# Patient Record
Sex: Male | Born: 1965 | Race: White | Hispanic: No | Marital: Single | Smoking: Current some day smoker
Health system: Southern US, Community
[De-identification: ages and names within clinical notes are randomized; demographics above are authoritative.]

## PROBLEM LIST (undated history)

## (undated) DIAGNOSIS — H579 Unspecified disorder of eye and adnexa: Secondary | ICD-10-CM

## (undated) DIAGNOSIS — M542 Cervicalgia: Secondary | ICD-10-CM

## (undated) DIAGNOSIS — G8929 Other chronic pain: Secondary | ICD-10-CM

## (undated) DIAGNOSIS — G4733 Obstructive sleep apnea (adult) (pediatric): Secondary | ICD-10-CM

## (undated) DIAGNOSIS — F419 Anxiety disorder, unspecified: Secondary | ICD-10-CM

## (undated) DIAGNOSIS — M549 Dorsalgia, unspecified: Secondary | ICD-10-CM

## (undated) HISTORY — DX: Other chronic pain: G89.29

## (undated) HISTORY — PX: EYE SURGERY: SHX253

## (undated) HISTORY — DX: Dorsalgia, unspecified: M54.9

## (undated) HISTORY — DX: Unspecified disorder of eye and adnexa: H57.9

## (undated) HISTORY — DX: Anxiety disorder, unspecified: F41.9

## (undated) HISTORY — DX: Obstructive sleep apnea (adult) (pediatric): G47.33

## (undated) HISTORY — DX: Cervicalgia: M54.2

---

## 2012-11-03 ENCOUNTER — Encounter: Payer: Self-pay | Admitting: Physician Assistant

## 2012-11-03 ENCOUNTER — Ambulatory Visit (INDEPENDENT_AMBULATORY_CARE_PROVIDER_SITE_OTHER): Payer: Medicaid Other | Admitting: Physician Assistant

## 2012-11-03 VITALS — BP 105/67 | HR 83 | Temp 97.0°F | Ht 68.0 in | Wt 223.4 lb

## 2012-11-03 DIAGNOSIS — Z0000 Encounter for general adult medical examination without abnormal findings: Secondary | ICD-10-CM

## 2012-11-03 DIAGNOSIS — M545 Low back pain: Secondary | ICD-10-CM

## 2012-11-03 DIAGNOSIS — H543 Unqualified visual loss, both eyes: Secondary | ICD-10-CM

## 2012-11-03 DIAGNOSIS — F17200 Nicotine dependence, unspecified, uncomplicated: Secondary | ICD-10-CM | POA: Insufficient documentation

## 2012-11-03 LAB — LIPID PANEL
HDL: 32 mg/dL — ABNORMAL LOW (ref >39)
LDL Cholesterol: 90 mg/dL (ref 0–99)
Total CHOL/HDL Ratio: 5.2 Ratio
VLDL: 43 mg/dL — ABNORMAL HIGH (ref 0–40)

## 2012-11-03 LAB — HEPATIC FUNCTION PANEL
ALT: 30 U/L (ref 0–53)
Bilirubin, Direct: 0.2 mg/dL (ref 0.0–0.3)
Total Bilirubin: 0.9 mg/dL (ref 0.3–1.2)

## 2012-11-03 LAB — BASIC METABOLIC PANEL WITH GFR
Calcium: 9.3 mg/dL (ref 8.4–10.5)
GFR, Est African American: >89 mL/min
GFR, Est Non African American: >89 mL/min
Glucose, Bld: 82 mg/dL (ref 70–99)
Potassium: 4.1 mEq/L (ref 3.5–5.3)
Sodium: 139 mEq/L (ref 135–145)

## 2012-11-03 MED ORDER — BUPROPION HCL ER (SMOKING DET) 150 MG PO TB12: 150.0000 mg | ORAL_TABLET | Freq: Two times a day (BID) | ORAL | Status: DC

## 2012-11-03 NOTE — Patient Instructions (Signed)
Smoking Cessation Quitting smoking is important to your health and has many advantages. However, it is not always easy to quit since nicotine is a very addictive drug. Often times, people try 3 times or more before being able to quit. This document explains the best ways for you to prepare to quit smoking. Quitting takes hard work and a lot of effort, but you can do it. ADVANTAGES OF QUITTING SMOKING  You will live longer, feel better, and live better.  Your body will feel the impact of quitting smoking almost immediately.  Within 20 minutes, blood pressure decreases. Your pulse returns to its normal level.  After 8 hours, carbon monoxide levels in the blood return to normal. Your oxygen level increases.  After 24 hours, the chance of having a heart attack starts to decrease. Your breath, hair, and body stop smelling like smoke.  After 48 hours, damaged nerve endings begin to recover. Your sense of taste and smell improve.  After 72 hours, the body is virtually free of nicotine. Your bronchial tubes relax and breathing becomes easier.  After 2 to 12 weeks, lungs can hold more air. Exercise becomes easier and circulation improves.  The risk of having a heart attack, stroke, cancer, or lung disease is greatly reduced.  After 1 year, the risk of coronary heart disease is cut in half.  After 5 years, the risk of stroke falls to the same as a nonsmoker.  After 10 years, the risk of lung cancer is cut in half and the risk of other cancers decreases significantly.  After 15 years, the risk of coronary heart disease drops, usually to the level of a nonsmoker.  If you are pregnant, quitting smoking will improve your chances of having a healthy baby.  The people you live with, especially any children, will be healthier.  You will have extra money to spend on things other than cigarettes. QUESTIONS TO THINK ABOUT BEFORE ATTEMPTING TO QUIT You may want to talk about your answers with your  caregiver.  Why do you want to quit?  If you tried to quit in the past, what helped and what did not?  What will be the most difficult situations for you after you quit? How will you plan to handle them?  Who can help you through the tough times? Your family? Friends? A caregiver?  What pleasures do you get from smoking? What ways can you still get pleasure if you quit? Here are some questions to ask your caregiver:  How can you help me to be successful at quitting?  What medicine do you think would be best for me and how should I take it?  What should I do if I need more help?  What is smoking withdrawal like? How can I get information on withdrawal? GET READY  Set a quit date.  Change your environment by getting rid of all cigarettes, ashtrays, matches, and lighters in your home, car, or work. Do not let people smoke in your home.  Review your past attempts to quit. Think about what worked and what did not. GET SUPPORT AND ENCOURAGEMENT You have a better chance of being successful if you have help. You can get support in many ways.  Tell your family, friends, and co-workers that you are going to quit and need their support. Ask them not to smoke around you.  Get individual, group, or telephone counseling and support. Programs are available at local hospitals and health centers. Call your local health department for   information about programs in your area.  Spiritual beliefs and practices may help some smokers quit.  Download a "quit meter" on your computer to keep track of quit statistics, such as how long you have gone without smoking, cigarettes not smoked, and money saved.  Get a self-help book about quitting smoking and staying off of tobacco. LEARN NEW SKILLS AND BEHAVIORS  Distract yourself from urges to smoke. Talk to someone, go for a walk, or occupy your time with a task.  Change your normal routine. Take a different route to work. Drink tea instead of coffee.  Eat breakfast in a different place.  Reduce your stress. Take a hot bath, exercise, or read a book.  Plan something enjoyable to do every day. Reward yourself for not smoking.  Explore interactive web-based programs that specialize in helping you quit. GET MEDICINE AND USE IT CORRECTLY Medicines can help you stop smoking and decrease the urge to smoke. Combining medicine with the above behavioral methods and support can greatly increase your chances of successfully quitting smoking.  Nicotine replacement therapy helps deliver nicotine to your body without the negative effects and risks of smoking. Nicotine replacement therapy includes nicotine gum, lozenges, inhalers, nasal sprays, and skin patches. Some may be available over-the-counter and others require a prescription.  Antidepressant medicine helps people abstain from smoking, but how this works is unknown. This medicine is available by prescription.  Nicotinic receptor partial agonist medicine simulates the effect of nicotine in your brain. This medicine is available by prescription. Ask your caregiver for advice about which medicines to use and how to use them based on your health history. Your caregiver will tell you what side effects to look out for if you choose to be on a medicine or therapy. Carefully read the information on the package. Do not use any other product containing nicotine while using a nicotine replacement product.  RELAPSE OR DIFFICULT SITUATIONS Most relapses occur within the first 3 months after quitting. Do not be discouraged if you start smoking again. Remember, most people try several times before finally quitting. You may have symptoms of withdrawal because your body is used to nicotine. You may crave cigarettes, be irritable, feel very hungry, cough often, get headaches, or have difficulty concentrating. The withdrawal symptoms are only temporary. They are strongest when you first quit, but they will go away within  10 14 days. To reduce the chances of relapse, try to:  Avoid drinking alcohol. Drinking lowers your chances of successfully quitting.  Reduce the amount of caffeine you consume. Once you quit smoking, the amount of caffeine in your body increases and can give you symptoms, such as a rapid heartbeat, sweating, and anxiety.  Avoid smokers because they can make you want to smoke.  Do not let weight gain distract you. Many smokers will gain weight when they quit, usually less than 10 pounds. Eat a healthy diet and stay active. You can always lose the weight gained after you quit.  Find ways to improve your mood other than smoking. FOR MORE INFORMATION  www.smokefree.gov  Document Released: 05/29/2001 Document Revised: 12/04/2011 Document Reviewed: 09/13/2011 Exit[employer] Patient Information 2013 [employer].  

## 2012-11-03 NOTE — Progress Notes (Signed)
Subjective:     Patient ID: Roberto Hanson, male   DOB: [birth year], 47 y.o.   MRN: [id number]  HPI Comments: Pt here for PE Extended amt of time since last PE Pt long term smker and would like Zyban rx He has a long hx of vision def with eye surg He would like referral    Review of Systems  Constitutional: Negative.   HENT: Negative.   Eyes:       Pt with sig hx of vision def  Respiratory: Negative.   Cardiovascular: Negative.   Gastrointestinal: Negative.   Endocrine: Negative.   Genitourinary: Negative.   Musculoskeletal: Positive for back pain.  Allergic/Immunologic: Negative.   Neurological: Negative.   Hematological: Negative.   Psychiatric/Behavioral: Negative.        Objective:   Physical Exam  Nursing note and vitals reviewed. Constitutional: He is oriented to person, place, and time. He appears well-developed and well-nourished.  HENT:  Head: Normocephalic and atraumatic.  Right Ear: External ear normal.  Left Ear: External ear normal.  Nose: Nose normal.  Mouth/Throat: Oropharynx is clear and moist.  Eyes: Conjunctivae are normal. Pupils are equal, round, and reactive to light.  Neck: Normal range of motion. Neck supple. No JVD present. No tracheal deviation present. No thyromegaly present.  Cardiovascular: Normal rate, regular rhythm and normal heart sounds.   Pulmonary/Chest: Effort normal and breath sounds normal.  Abdominal: Soft. Bowel sounds are normal. He exhibits no distension and no mass. There is no tenderness. There is no rebound and no guarding.  Genitourinary: Penis normal. No penile tenderness.  No hernia or ing nodes  Musculoskeletal: Normal range of motion.  Lymphadenopathy:    He has no cervical adenopathy.  Neurological: He is alert and oriented to person, place, and time. He has normal reflexes. No cranial nerve deficit.  Skin: Skin is warm and dry.  Psychiatric: He has a normal mood and affect.       Assessment:    Physical  exam 1. Smoking addiction   2. Decreased vision in both eyes   3. Lumbago   4. Routine general medical examination at a health care facility        Plan:     Zyban rx called in- informed ins may not cover Referral to [facility] Weight loss discussed F/U pending labs

## 2012-11-04 LAB — PSA: PSA: 0.97 ng/mL (ref <=4.00)

## 2012-12-01 ENCOUNTER — Telehealth: Payer: Self-pay | Admitting: Physician Assistant

## 2012-12-02 NOTE — Telephone Encounter (Signed)
Waiting on Bill W to sign

## 2012-12-05 NOTE — Telephone Encounter (Signed)
Form faxed, pt aware. 

## 2013-01-27 ENCOUNTER — Other Ambulatory Visit: Payer: Self-pay

## 2013-01-27 MED ORDER — BUPROPION HCL ER (SMOKING DET) 150 MG PO TB12: 150.0000 mg | ORAL_TABLET | Freq: Two times a day (BID) | ORAL | Status: DC

## 2013-01-27 NOTE — Telephone Encounter (Signed)
Last seen 11/03/12  [facility]   

## 2013-02-02 DIAGNOSIS — F411 Generalized anxiety disorder: Secondary | ICD-10-CM | POA: Diagnosis not present

## 2013-02-09 ENCOUNTER — Ambulatory Visit (INDEPENDENT_AMBULATORY_CARE_PROVIDER_SITE_OTHER): Payer: Medicare Other | Admitting: Family Medicine

## 2013-02-09 VITALS — BP 120/77 | HR 98 | Temp 97.7°F | Ht 68.0 in | Wt 209.0 lb

## 2013-02-09 DIAGNOSIS — F411 Generalized anxiety disorder: Secondary | ICD-10-CM

## 2013-02-09 MED ORDER — ALPRAZOLAM 0.5 MG PO TABS: 0.5000 mg | ORAL_TABLET | Freq: Every evening | ORAL | Status: DC | PRN

## 2013-02-09 NOTE — Patient Instructions (Signed)

## 2013-02-09 NOTE — Progress Notes (Signed)
  Subjective:    Patient ID: Roberto Hanson, male    DOB: 1966-02-15, 47 y.o.   MRN: [id number]  HPI This 47 y.o. male presents for evaluation of osas.  He is using cpap for osas And he is sleeping well and he states he has been doing very well.  He has Been having a lot of anxiety and he has a lot of social anxiety.  He wants some Medicine for anxiety.  He has noticed his stress level is high and he would like something For stress.  He states he gets nervous 3x week lately.  He needs an eval for his sleep apnea In order to get a new cpap machine.   Review of Systems  C/o anxiety.  No chest pain, SOB, HA, dizziness, vision change, N/V, diarrhea, constipation, dysuria, urinary urgency or frequency, myalgias, arthralgias or rash.     Objective:   Physical Exam Vital signs noted  Well developed well nourished male.  HEENT - Head atraumatic Normocephalic                Eyes - PERRLA, Conjuctiva - clear Sclera- Clear EOMI                Ears - EAC's Wnl TM's Wnl Gross Hearing WNL                Nose - Nares patent                 Throat - oropharanx wnl Respiratory - Lungs CTA bilateral Cardiac - RRR S1 and S2 without murmur GI - Abdomen soft Nontender and bowel sounds active x 4 Extremities - No edema. Neuro - Grossly intact.       Assessment & Plan:  Anxiety state, unspecified - Plan: ALPRAZolam (XANAX) 0.5 MG tablet Continue buproprion 150mg  po bid. Follow up for refills.  OSAS - Continue current CPAP

## 2013-03-13 ENCOUNTER — Ambulatory Visit: Payer: Medicare Other | Admitting: Family Medicine

## 2013-03-16 ENCOUNTER — Telehealth: Payer: Self-pay | Admitting: Family Medicine

## 2013-03-16 ENCOUNTER — Ambulatory Visit (INDEPENDENT_AMBULATORY_CARE_PROVIDER_SITE_OTHER): Payer: Medicare Other | Admitting: Family Medicine

## 2013-03-16 ENCOUNTER — Encounter: Payer: Self-pay | Admitting: Family Medicine

## 2013-03-16 VITALS — BP 122/81 | HR 81 | Temp 98.7°F | Ht 68.0 in | Wt 217.0 lb

## 2013-03-16 DIAGNOSIS — F411 Generalized anxiety disorder: Secondary | ICD-10-CM

## 2013-03-16 DIAGNOSIS — M549 Dorsalgia, unspecified: Secondary | ICD-10-CM

## 2013-03-16 DIAGNOSIS — G4733 Obstructive sleep apnea (adult) (pediatric): Secondary | ICD-10-CM

## 2013-03-16 DIAGNOSIS — F524 Premature ejaculation: Secondary | ICD-10-CM

## 2013-03-16 MED ORDER — SERTRALINE HCL 50 MG PO TABS: 50.0000 mg | ORAL_TABLET | Freq: Every day | ORAL | Status: DC

## 2013-03-16 MED ORDER — NAPROXEN 500 MG PO TABS
500.0000 mg | ORAL_TABLET | Freq: Two times a day (BID) | ORAL | Status: DC
Start: 2013-03-16 — End: 2013-03-29

## 2013-03-16 MED ORDER — ALPRAZOLAM 0.5 MG PO TABS: 0.5000 mg | ORAL_TABLET | Freq: Every evening | ORAL | Status: DC | PRN

## 2013-03-16 NOTE — Telephone Encounter (Signed)
Taken care of already

## 2013-03-16 NOTE — Patient Instructions (Signed)
Back Pain, Adult  Low back pain is very common. About 1 in 5 people have back pain. The cause of low back pain is rarely dangerous. The pain often gets better over time. About half of people with a sudden onset of back pain feel better in just 2 weeks. About 8 in 10 people feel better by 6 weeks.   CAUSES  Some common causes of back pain include:  · Strain of the muscles or ligaments supporting the spine.  · Wear and tear (degeneration) of the spinal discs.  · Arthritis.  · Direct injury to the back.  DIAGNOSIS  Most of the time, the direct cause of low back pain is not known. However, back pain can be treated effectively even when the exact cause of the pain is unknown. Answering your caregiver's questions about your overall health and symptoms is one of the most accurate ways to make sure the cause of your pain is not dangerous. If your caregiver needs more information, he or she may order lab work or imaging tests (X-rays or MRIs). However, even if imaging tests show changes in your back, this usually does not require surgery.  HOME CARE INSTRUCTIONS  For many people, back pain returns. Since low back pain is rarely dangerous, it is often a condition that people can learn to manage on their own.   · Remain active. It is stressful on the back to sit or stand in one place. Do not sit, drive, or stand in one place for more than 30 minutes at a time. Take short walks on level surfaces as soon as pain allows. Try to increase the length of time you walk each day.  · Do not stay in bed. Resting more than 1 or 2 days can delay your recovery.  · Do not avoid exercise or work. Your body is made to move. It is not dangerous to be active, even though your back may hurt. Your back will likely heal faster if you return to being active before your pain is gone.  · Pay attention to your body when you  bend and lift. Many people have less discomfort when lifting if they bend their knees, keep the load close to their bodies, and  avoid twisting. Often, the most comfortable positions are those that put less stress on your recovering back.  · Find a comfortable position to sleep. Use a firm mattress and lie on your side with your knees slightly bent. If you lie on your back, put a pillow under your knees.  · Only take over-the-counter or prescription medicines as directed by your caregiver. Over-the-counter medicines to reduce pain and inflammation are often the most helpful. Your caregiver may prescribe muscle relaxant drugs. These medicines help dull your pain so you can more quickly return to your normal activities and healthy exercise.  · Put ice on the injured area.  · Put ice in a plastic bag.  · Place a towel between your skin and the bag.  · Leave the ice on for 15-20 minutes, 3-4 times a day for the first 2 to 3 days. After that, ice and heat may be alternated to reduce pain and spasms.  · Ask your caregiver about trying back exercises and gentle massage. This may be of some benefit.  · Avoid feeling anxious or stressed. Stress increases muscle tension and can worsen back pain. It is important to recognize when you are anxious or stressed and learn ways to manage it. Exercise is a great option.  SEEK MEDICAL CARE IF:  · You have pain that is not relieved with rest or   medicine.  · You have pain that does not improve in 1 week.  · You have new symptoms.  · You are generally not feeling well.  SEEK IMMEDIATE MEDICAL CARE IF:   · You have pain that radiates from your back into your legs.  · You develop new bowel or bladder control problems.  · You have unusual weakness or numbness in your arms or legs.  · You develop nausea or vomiting.  · You develop abdominal pain.  · You feel faint.  Document Released: 06/04/2005 Document Revised: 12/04/2011 Document Reviewed: 10/23/2010  Exit[employer]® Patient Information ©2014 [employer].

## 2013-03-16 NOTE — Progress Notes (Signed)
  Subjective:    Patient ID: Roberto Hanson, male    DOB: Jun 22, 1965, 47 y.o.   MRN: [id number]  HPI This 47 y.o. male presents for evaluation of back pain, needs rx For CPAP for tx of OSAS, and having premature ejaculation. He states he has lumbago and it is moderate on the pain scale.   Review of Systems C/o back pain, premature ejaculation. No chest pain, SOB, HA, dizziness, vision change, N/V, diarrhea, constipation, dysuria, urinary urgency or frequency or rash.     Objective:   Physical Exam Vital signs noted  Well developed well nourished male.  HEENT - Head atraumatic Normocephalic                Eyes - PERRLA, Conjuctiva - clear Sclera- Clear EOMI                Ears - EAC's Wnl TM's Wnl Gross Hearing WNL                Nose - Nares patent                 Throat - oropharanx wnl Respiratory - Lungs CTA bilateral Cardiac - RRR S1 and S2 without murmur GI - Abdomen soft Nontender and bowel sounds active x 4 Extremities - No edema. Neuro - Grossly intact.       Assessment & Plan:  OSA (obstructive sleep apnea) - Plan: For home use only DME continuous positive airway pressure (CPAP)  Anxiety state, unspecified - Plan: ALPRAZolam (XANAX) 0.5 MG tablet one half to one po qhs prn  Back pain - Plan: naproxen (NAPROSYN) 500 MG tablet po bid x 10 days  Premature ejaculation - Plan: sertraline (ZOLOFT) 50 MG tablet po qd and follow up prn and in 3 months.  Follow up in 3 months  Deatra Canter FNP

## 2013-03-18 ENCOUNTER — Telehealth: Payer: Self-pay | Admitting: Family Medicine

## 2013-03-25 ENCOUNTER — Telehealth: Payer: Self-pay | Admitting: Family Medicine

## 2013-03-27 ENCOUNTER — Telehealth: Payer: Self-pay | Admitting: Family Medicine

## 2013-03-27 NOTE — Telephone Encounter (Signed)
CAN YOU REVIEW THIS FOR A NOTE

## 2013-03-27 NOTE — Telephone Encounter (Signed)
PT NOTIFIED AND WILL WAIT FOR [facility] TO REVIEW

## 2013-03-29 ENCOUNTER — Other Ambulatory Visit: Payer: Self-pay | Admitting: Family Medicine

## 2013-03-30 NOTE — Telephone Encounter (Signed)
Will you check on this please?  

## 2013-03-31 NOTE — Telephone Encounter (Signed)
NTBS.

## 2013-04-02 NOTE — Telephone Encounter (Signed)
appt made for 10/20

## 2013-04-06 ENCOUNTER — Ambulatory Visit (INDEPENDENT_AMBULATORY_CARE_PROVIDER_SITE_OTHER): Payer: Medicare Other | Admitting: Family Medicine

## 2013-04-06 ENCOUNTER — Encounter: Payer: Self-pay | Admitting: Family Medicine

## 2013-04-06 ENCOUNTER — Encounter (INDEPENDENT_AMBULATORY_CARE_PROVIDER_SITE_OTHER): Payer: Self-pay

## 2013-04-06 VITALS — BP 115/75 | HR 88 | Temp 97.9°F | Wt 212.6 lb

## 2013-04-06 DIAGNOSIS — M549 Dorsalgia, unspecified: Secondary | ICD-10-CM

## 2013-04-06 DIAGNOSIS — F411 Generalized anxiety disorder: Secondary | ICD-10-CM | POA: Diagnosis not present

## 2013-04-06 NOTE — Progress Notes (Signed)
  Subjective:    Patient ID: Roberto Hanson, male    DOB: August 09, 1965, 47 y.o.   MRN: [id number]  HPI This 47 y.o. male presents for evaluation of anxiety and depression.  He Has hx of back pain and has difficulty with walking up stairs and his Vision problems also make walking up stairs difficult.  He needs letter To his apartment complex so he can get a bottom apartment.   Review of Systems No chest pain, SOB, HA, dizziness, vision change, N/V, diarrhea, constipation, dysuria, urinary urgency or frequency, myalgias, arthralgias or rash.     Objective:   Physical Exam Vital signs noted  Well developed well nourished male.  HEENT - Head atraumatic Normocephalic                Eyes - PERRLA, Conjuctiva - clear Sclera- Clear EOMI                Ears - EAC's Wnl TM's Wnl Gross Hearing WNL                Nose - Nares patent                 Throat - oropharanx wnl Respiratory - Lungs CTA bilateral Cardiac - RRR S1 and S2 without murmur GI - Abdomen soft Nontender and bowel sounds active x 4 Extremities - No edema. Neuro - Grossly intact.       Assessment & Plan:  Back pain - Note given patient to give to apartment manager.  Anxiety state, unspecified - Continue sertraline and xanax prn  OSAS - Await cpap from company  Deatra Canter FNP

## 2013-04-06 NOTE — Patient Instructions (Signed)

## 2013-06-15 ENCOUNTER — Other Ambulatory Visit: Payer: Self-pay | Admitting: Family Medicine

## 2013-06-17 NOTE — Telephone Encounter (Signed)
Please advise 

## 2013-10-26 ENCOUNTER — Other Ambulatory Visit: Payer: Self-pay | Admitting: Family Medicine

## 2013-10-30 ENCOUNTER — Encounter: Payer: Self-pay | Admitting: Family Medicine

## 2013-10-30 ENCOUNTER — Ambulatory Visit (INDEPENDENT_AMBULATORY_CARE_PROVIDER_SITE_OTHER): Payer: Medicare Other | Admitting: Family Medicine

## 2013-10-30 VITALS — BP 101/66 | HR 74 | Temp 97.6°F | Ht 67.0 in | Wt 232.0 lb

## 2013-10-30 DIAGNOSIS — F411 Generalized anxiety disorder: Secondary | ICD-10-CM

## 2013-10-30 MED ORDER — ALPRAZOLAM 0.5 MG PO TABS: 0.5000 mg | ORAL_TABLET | Freq: Every evening | ORAL | Status: DC | PRN

## 2013-10-30 NOTE — Progress Notes (Signed)
   Subjective:    Patient ID: Roberto Hanson, male    DOB: 1965/12/19, 48 y.o.   MRN: [id number]  HPI This 48 y.o. male presents for evaluation of refill on xanax.  He doesn't want to take sertraline.  He has occasional anxiety.   Review of Systems No chest pain, SOB, HA, dizziness, vision change, N/V, diarrhea, constipation, dysuria, urinary urgency or frequency, myalgias, arthralgias or rash.     Objective:   Physical Exam  Vital signs noted  Well developed well nourished male.  HEENT - Head atraumatic Normocephalic                Eyes - PERRLA, Conjuctiva - clear Sclera- Clear EOMI                Ears - EAC's Wnl TM's Wnl Gross Hearing WNL                Nose - Nares patent                 Throat - oropharanx wnl Respiratory - Lungs CTA bilateral Cardiac - RRR S1 and S2 without murmur GI - Abdomen soft Nontender and bowel sounds active x 4 Extremities - No edema. Neuro - Grossly intact.      Assessment & Plan:  Anxiety state, unspecified - Plan: ALPRAZolam (XANAX) 0.5 MG tablet Recommend to make last and not escalate and explained how sertraline works and he Refuses.  Lysbeth Penner FNP

## 2014-03-23 ENCOUNTER — Telehealth: Payer: Self-pay | Admitting: Family Medicine

## 2014-03-23 NOTE — Telephone Encounter (Signed)
Called in.

## 2014-03-23 NOTE — Telephone Encounter (Signed)
This is okay x1 as long as it is not too early

## 2014-04-20 ENCOUNTER — Telehealth: Payer: Self-pay | Admitting: Family Medicine

## 2014-04-20 ENCOUNTER — Other Ambulatory Visit: Payer: Self-pay | Admitting: Family Medicine

## 2014-04-20 DIAGNOSIS — F411 Generalized anxiety disorder: Secondary | ICD-10-CM

## 2014-04-20 MED ORDER — ALPRAZOLAM 0.5 MG PO TABS: 0.5000 mg | ORAL_TABLET | Freq: Every evening | ORAL | Status: DC | PRN

## 2014-04-20 NOTE — Telephone Encounter (Signed)
Patient is requesting a refill on his xanax

## 2014-04-23 NOTE — Telephone Encounter (Signed)
Pt notified needs appt Verbalizes understanding

## 2014-04-29 ENCOUNTER — Encounter: Payer: Self-pay | Admitting: Family Medicine

## 2014-04-29 ENCOUNTER — Ambulatory Visit (INDEPENDENT_AMBULATORY_CARE_PROVIDER_SITE_OTHER): Payer: Medicare Other | Admitting: Family Medicine

## 2014-04-29 VITALS — BP 107/60 | HR 76 | Temp 97.6°F | Ht 67.0 in | Wt 230.6 lb

## 2014-04-29 DIAGNOSIS — M549 Dorsalgia, unspecified: Secondary | ICD-10-CM | POA: Diagnosis not present

## 2014-04-29 DIAGNOSIS — G8929 Other chronic pain: Secondary | ICD-10-CM | POA: Diagnosis not present

## 2014-04-29 DIAGNOSIS — F411 Generalized anxiety disorder: Secondary | ICD-10-CM

## 2014-04-29 MED ORDER — NAPROXEN 500 MG PO TABS: 500.0000 mg | ORAL_TABLET | Freq: Two times a day (BID) | ORAL | Status: DC

## 2014-04-29 MED ORDER — ALPRAZOLAM 0.5 MG PO TABS: 0.5000 mg | ORAL_TABLET | Freq: Every evening | ORAL | Status: DC | PRN

## 2014-04-29 NOTE — Progress Notes (Signed)
   Subjective:    Patient ID: Roberto Hanson, male    DOB: Sep 13, 1965, 48 y.o.   MRN: [id number]  HPI Patient is here for c/o back pain and wanting to get better control of his pain.  He states he has moderate to severe pain and is requesting pain management referral.  He needs refills on his anxiety medicine.  Review of Systems  Constitutional: Negative for fever.  HENT: Negative for ear pain.   Eyes: Negative for discharge.  Respiratory: Negative for cough.   Cardiovascular: Negative for chest pain.  Gastrointestinal: Negative for abdominal distention.  Endocrine: Negative for polyuria.  Genitourinary: Negative for difficulty urinating.  Musculoskeletal: Negative for gait problem and neck pain.  Skin: Negative for color change and rash.  Neurological: Negative for speech difficulty and headaches.  Psychiatric/Behavioral: Negative for agitation.       Objective:    BP 107/60 mmHg  Pulse 76  Temp(Src) 97.6 F (36.4 C) (Oral)  Ht 5\' 7"  (1.702 m)  Wt 230 lb 9.6 oz (104.599 kg)  BMI 36.11 kg/m2 Physical Exam  Constitutional: He is oriented to person, place, and time. He appears well-developed and well-nourished.  HENT:  Head: Normocephalic and atraumatic.  Mouth/Throat: Oropharynx is clear and moist.  Eyes: Pupils are equal, round, and reactive to light.  Neck: Normal range of motion. Neck supple.  Cardiovascular: Normal rate and regular rhythm.   No murmur heard. Pulmonary/Chest: Effort normal and breath sounds normal.  Abdominal: Soft. Bowel sounds are normal. There is no tenderness.  Neurological: He is alert and oriented to person, place, and time.  Skin: Skin is warm and dry.  Psychiatric: He has a normal mood and affect.          Assessment & Plan:     ICD-9-CM ICD-10-CM   1. GAD (generalized anxiety disorder) 300.02 F41.1 ALPRAZolam (XANAX) 0.5 MG tablet  2. Chronic back pain 724.5 M54.9 naproxen (NAPROSYN) 500 MG tablet   338.29 G89.29 Ambulatory  referral to Pain Clinic     No Follow-up on file.  Lysbeth Penner FNP

## 2014-07-07 ENCOUNTER — Telehealth: Payer: Self-pay

## 2014-07-07 ENCOUNTER — Telehealth: Payer: Self-pay | Admitting: Family Medicine

## 2014-07-07 ENCOUNTER — Other Ambulatory Visit: Payer: Self-pay | Admitting: Family Medicine

## 2014-07-07 NOTE — Telephone Encounter (Signed)
He needs to stick with current pain clinic and talk to them about his treatment.

## 2014-07-07 NOTE — Telephone Encounter (Signed)
Not satisfied with pain clinic we sent him to so he now wants to go to Dr Nelva Bush

## 2014-10-08 ENCOUNTER — Ambulatory Visit: Payer: Medicare Other | Admitting: Family Medicine

## 2014-10-11 ENCOUNTER — Other Ambulatory Visit: Payer: Self-pay | Admitting: Family Medicine

## 2014-10-13 NOTE — Telephone Encounter (Signed)
Called into pharmacy

## 2014-10-13 NOTE — Telephone Encounter (Signed)
Please call in xanax with 0 refills no more refills without being seen  

## 2014-10-13 NOTE — Telephone Encounter (Signed)
Last seen 04/30/15  [facility] [place]  If approved route to call into [facility]

## 2014-10-18 ENCOUNTER — Ambulatory Visit: Payer: Medicare Other | Admitting: Family Medicine

## 2014-10-19 ENCOUNTER — Ambulatory Visit: Payer: Medicare Other | Admitting: Family Medicine

## 2014-11-03 ENCOUNTER — Ambulatory Visit (INDEPENDENT_AMBULATORY_CARE_PROVIDER_SITE_OTHER): Payer: Medicare Other | Admitting: Family Medicine

## 2014-11-03 ENCOUNTER — Encounter: Payer: Self-pay | Admitting: Family Medicine

## 2014-11-03 VITALS — BP 116/75 | HR 68 | Temp 98.4°F | Ht 67.0 in | Wt 234.0 lb

## 2014-11-03 DIAGNOSIS — F411 Generalized anxiety disorder: Secondary | ICD-10-CM | POA: Diagnosis not present

## 2014-11-03 MED ORDER — ALPRAZOLAM 0.5 MG PO TABS
0.5000 mg | ORAL_TABLET | Freq: Every evening | ORAL | Status: DC | PRN
Start: 2014-11-03 — End: 2014-12-23

## 2014-11-03 MED ORDER — TRAMADOL HCL 50 MG PO TABS: 50.0000 mg | ORAL_TABLET | Freq: Three times a day (TID) | ORAL | Status: DC | PRN

## 2014-11-03 NOTE — Progress Notes (Signed)
   Subjective:    Patient ID: Roberto Hanson, male    DOB: 1966/05/29, 49 y.o.   MRN: [id number]  HPI  49 year old gentleman here to follow-up chronic back pain and anxiety. At his last visit here he was referred to pain clinic but he felt like he did not belong there. He takes both alprazolam and tramadol, that he is requesting after having tried a friend's tramadol on an as-needed basis and I feel like abuse potential is low.    Review of Systems  Respiratory: Negative.   Cardiovascular: Negative.   Gastrointestinal: Negative.   Musculoskeletal: Positive for back pain.  Psychiatric/Behavioral: Positive for suicidal ideas.   Patient Active Problem List   Diagnosis Date Noted  . Generalized anxiety disorder 11/03/2014  . Smoking addiction 11/03/2012  . Decreased vision in both eyes 11/03/2012  . Lumbago 11/03/2012   Outpatient Encounter Prescriptions as of 11/03/2014  Medication Sig  . ALPRAZolam (XANAX) 0.5 MG tablet Take 1 tablet (0.5 mg total) by mouth at bedtime as needed. for sleep  . [DISCONTINUED] ALPRAZolam (XANAX) 0.5 MG tablet TAKE 1 TABLET AT BEDTIME AS NEEDED FOR SLEEP  . traMADol (ULTRAM) 50 MG tablet Take 1 tablet (50 mg total) by mouth every 8 (eight) hours as needed.  . [DISCONTINUED] naproxen (NAPROSYN) 500 MG tablet Take 1 tablet (500 mg total) by mouth 2 (two) times daily with a meal.   No facility-administered encounter medications on file as of 11/03/2014.       Objective:   Physical Exam  Cardiovascular: Normal rate.   Pulmonary/Chest: Effort normal.  Musculoskeletal: Normal range of motion.  Straight leg raising is negative. Deep tendon reflexes are symmetric at the knees          Assessment & Plan:  1. Generalized anxiety disorder Symptoms well controlled on as needed use of alprazolam. He takes it mostly at bedtime for sleep or if there is an event that really creates anxiety during the daytime  2. Back pain  Pain seems to be intermittent is  not radiate down legs or affect function  Wardell Honour MD

## 2014-12-13 ENCOUNTER — Other Ambulatory Visit: Payer: Self-pay

## 2014-12-13 ENCOUNTER — Telehealth: Payer: Self-pay | Admitting: *Deleted

## 2014-12-13 MED ORDER — TRAMADOL HCL 50 MG PO TABS: 50.0000 mg | ORAL_TABLET | Freq: Three times a day (TID) | ORAL | Status: DC | PRN

## 2014-12-13 NOTE — Telephone Encounter (Signed)
Pt called requesting refill of Tramadol In Dr Ammie Ferrier absence, Dr Cindi Carbon # 20 Pt notified Rx Printed and to the front for pt pick up

## 2014-12-13 NOTE — Telephone Encounter (Signed)
Last seen 11/03/14  Dr Sabra Heck  If approved print

## 2014-12-14 ENCOUNTER — Other Ambulatory Visit: Payer: Self-pay | Admitting: Family Medicine

## 2014-12-23 ENCOUNTER — Encounter: Payer: Self-pay | Admitting: Family Medicine

## 2014-12-23 ENCOUNTER — Ambulatory Visit (INDEPENDENT_AMBULATORY_CARE_PROVIDER_SITE_OTHER): Payer: Medicare Other | Admitting: Family Medicine

## 2014-12-23 VITALS — BP 111/73 | HR 75 | Temp 97.6°F | Ht 67.0 in | Wt 236.0 lb

## 2014-12-23 DIAGNOSIS — F411 Generalized anxiety disorder: Secondary | ICD-10-CM | POA: Diagnosis not present

## 2014-12-23 DIAGNOSIS — G8929 Other chronic pain: Secondary | ICD-10-CM | POA: Diagnosis not present

## 2014-12-23 DIAGNOSIS — M549 Dorsalgia, unspecified: Secondary | ICD-10-CM

## 2014-12-23 MED ORDER — ALPRAZOLAM 0.5 MG PO TABS: 0.5000 mg | ORAL_TABLET | Freq: Two times a day (BID) | ORAL | Status: DC | PRN

## 2014-12-23 MED ORDER — TRAMADOL HCL 50 MG PO TABS: 50.0000 mg | ORAL_TABLET | Freq: Three times a day (TID) | ORAL | Status: DC | PRN

## 2014-12-23 NOTE — Progress Notes (Signed)
   Subjective:    Patient ID: Roberto Hanson, male    DOB: 1965/12/02, 49 y.o.   MRN: [id number]  HPI Patient here today for follow up on chronic problems. He continues to need tramadol for back pain and alprazolam for anxiety. Again, he tells me of his reluctance to use either medicine and I think abuse potential is low but I do not think withholding medicine when there are will needs serves him well.      Patient Active Problem List   Diagnosis Date Noted  . Generalized anxiety disorder 11/03/2014  . Smoking addiction 11/03/2012  . Decreased vision in both eyes 11/03/2012  . Lumbago 11/03/2012   Outpatient Encounter Prescriptions as of 12/23/2014  Medication Sig  . ALPRAZolam (XANAX) 0.5 MG tablet Take 1 tablet (0.5 mg total) by mouth at bedtime as needed. for sleep  . traMADol (ULTRAM) 50 MG tablet Take 1 tablet (50 mg total) by mouth every 8 (eight) hours as needed.   No facility-administered encounter medications on file as of 12/23/2014.      Review of Systems  Constitutional: Negative.   HENT: Negative.   Eyes: Negative.   Respiratory: Negative.   Cardiovascular: Negative.   Gastrointestinal: Negative.   Endocrine: Negative.   Genitourinary: Negative.   Musculoskeletal: Positive for back pain.  Skin: Negative.   Allergic/Immunologic: Negative.   Neurological: Negative.   Hematological: Negative.   Psychiatric/Behavioral: The patient is nervous/anxious.        Objective:   Physical Exam  Constitutional: He is oriented to person, place, and time. He appears well-developed and well-nourished.  Cardiovascular: Normal rate and regular rhythm.   Pulmonary/Chest: Effort normal and breath sounds normal.  Musculoskeletal:  Back exam: Straight leg raising is negative. Deep tendon reflexes are symmetric at the knee and ankle  Neurological: He is alert and oriented to person, place, and time.   BP 111/73 mmHg  Pulse 75  Temp(Src) 97.6 F (36.4 C) (Oral)  Ht 5'  7" (1.702 m)  Wt 236 lb (107.049 kg)  BMI 36.95 kg/m2        Assessment & Plan:  1. GAD (generalized anxiety disorder) Anxieties are not really bad. He uses Xanax mostly as a sleeping aid and takes an occasional one in the daytime. I did increase frequency on his prescription today so that he will not have to go to the drugstore as much for refills  2. Chronic back pain No real evidence of disc disease. Would assume this is related to mechanical low back strain  Wardell Honour MD

## 2014-12-23 NOTE — Patient Instructions (Signed)
Continue current medications. Continue good therapeutic lifestyle changes which include good diet and exercise. Fall precautions discussed with patient. If an FOBT was given today- please return it to our front desk.  After your visit with [place] today you will receive a survey in the mail or online from [employer] regarding your care with [place]. Please take a moment to fill this out. Your feedback is very important to [place] as you can help [place] better understand your patient needs as well as improve your experience and satisfaction. WE CARE ABOUT YOU!!!

## 2015-01-25 ENCOUNTER — Other Ambulatory Visit: Payer: Self-pay | Admitting: *Deleted

## 2015-01-25 NOTE — Telephone Encounter (Signed)
Last seen and filled by Dr. Sabra Heck on 12/23/14. Rx will print

## 2015-01-26 MED ORDER — TRAMADOL HCL 50 MG PO TABS: 50.0000 mg | ORAL_TABLET | Freq: Three times a day (TID) | ORAL | Status: DC | PRN

## 2015-01-26 NOTE — Telephone Encounter (Signed)
Patient aware rx is ready to be picked up 

## 2015-02-09 ENCOUNTER — Other Ambulatory Visit: Payer: Self-pay | Admitting: *Deleted

## 2015-02-09 DIAGNOSIS — M549 Dorsalgia, unspecified: Secondary | ICD-10-CM

## 2015-03-08 ENCOUNTER — Telehealth: Payer: Self-pay | Admitting: Family Medicine

## 2015-03-11 ENCOUNTER — Telehealth: Payer: Self-pay | Admitting: Family Medicine

## 2015-03-11 MED ORDER — TRAMADOL HCL 50 MG PO TABS: 50.0000 mg | ORAL_TABLET | Freq: Three times a day (TID) | ORAL | Status: DC | PRN

## 2015-03-11 NOTE — Telephone Encounter (Signed)
Patient not wanting to schedule awv at this time.

## 2015-03-11 NOTE — Telephone Encounter (Signed)
[facility] pt, last filled 01/26/15, last seen 0/07/16. Rx will print, have nurse call pt

## 2015-03-15 ENCOUNTER — Other Ambulatory Visit: Payer: Self-pay | Admitting: Family Medicine

## 2015-03-16 MED ORDER — TRAMADOL HCL 50 MG PO TABS: 50.0000 mg | ORAL_TABLET | Freq: Three times a day (TID) | ORAL | Status: DC | PRN

## 2015-03-16 NOTE — Telephone Encounter (Signed)
Patient aware that rx is ready to be picked up.  

## 2015-03-17 ENCOUNTER — Ambulatory Visit: Payer: Medicare Other | Admitting: Family Medicine

## 2015-03-24 ENCOUNTER — Encounter: Payer: Self-pay | Admitting: Family Medicine

## 2015-03-24 ENCOUNTER — Ambulatory Visit (INDEPENDENT_AMBULATORY_CARE_PROVIDER_SITE_OTHER): Payer: Medicare Other | Admitting: Family Medicine

## 2015-03-24 VITALS — BP 115/70 | HR 81 | Temp 97.5°F | Ht 67.0 in | Wt 229.6 lb

## 2015-03-24 DIAGNOSIS — F411 Generalized anxiety disorder: Secondary | ICD-10-CM

## 2015-03-24 DIAGNOSIS — M545 Low back pain, unspecified: Secondary | ICD-10-CM

## 2015-03-24 DIAGNOSIS — G8929 Other chronic pain: Secondary | ICD-10-CM

## 2015-03-24 MED ORDER — ALPRAZOLAM 0.5 MG PO TABS: 0.5000 mg | ORAL_TABLET | Freq: Two times a day (BID) | ORAL | Status: DC | PRN

## 2015-03-24 MED ORDER — TRAMADOL HCL 50 MG PO TABS: 50.0000 mg | ORAL_TABLET | Freq: Three times a day (TID) | ORAL | Status: DC | PRN

## 2015-03-24 NOTE — Progress Notes (Signed)
   Subjective:    Patient ID: Roberto Hanson, male    DOB: 11-Feb-1966, 49 y.o.   MRN: [id number]  HPI 49 year old gentleman with generalized anxiety disorder and chronic low back pain. Symptoms have been managed with Xanax which he takes 0.5 mg twice a day and tramadol which she takes 50 mg 3 times a day. He seems to be using these on schedule and there is no suggestion of abuse.  Patient Active Problem List   Diagnosis Date Noted  . Generalized anxiety disorder 11/03/2014  . Smoking addiction 11/03/2012  . Decreased vision in both eyes 11/03/2012  . Lumbago 11/03/2012   Outpatient Encounter Prescriptions as of 03/24/2015  Medication Sig  . ALPRAZolam (XANAX) 0.5 MG tablet Take 1 tablet (0.5 mg total) by mouth 2 (two) times daily as needed. for sleep  . traMADol (ULTRAM) 50 MG tablet Take 1 tablet (50 mg total) by mouth every 8 (eight) hours as needed.   No facility-administered encounter medications on file as of 03/24/2015.      Review of Systems  Musculoskeletal: Positive for back pain.  Psychiatric/Behavioral: The patient is nervous/anxious.        Objective:   Physical Exam  Constitutional: He appears well-developed and well-nourished.  Musculoskeletal:  Back: Flexion and lateral bending is normal but extension produces pain. Straight leg raising is negative. Deep tendon reflexes are symmetric at the knee and ankles.          Assessment & Plan:  1. Chronic bilateral low back pain without sciatica Symptoms are controlled with, and all will continue same I suggested several flexion exercises which she could do every morning for stretching  2. Generalized anxiety disorder Symptoms are controlled with alprazolam will continue same twice a day dosing  Wardell Honour MD

## 2015-05-11 ENCOUNTER — Other Ambulatory Visit: Payer: Self-pay

## 2015-05-11 MED ORDER — TRAMADOL HCL 50 MG PO TABS: 50.0000 mg | ORAL_TABLET | Freq: Three times a day (TID) | ORAL | Status: DC | PRN

## 2015-05-11 NOTE — Telephone Encounter (Signed)
Prescription placed at front desk, patient informed

## 2015-05-11 NOTE — Telephone Encounter (Signed)
Patient last seen in office on 03-24-15. Rx last filled on 04-14-15. Please advise. If approved please print and route to Pool A so nurse can call patient to pick up

## 2015-06-24 ENCOUNTER — Ambulatory Visit (INDEPENDENT_AMBULATORY_CARE_PROVIDER_SITE_OTHER): Payer: Medicare Other | Admitting: Family Medicine

## 2015-06-24 ENCOUNTER — Encounter: Payer: Self-pay | Admitting: Family Medicine

## 2015-06-24 VITALS — BP 120/70 | HR 72 | Temp 97.7°F | Ht 67.0 in | Wt 232.0 lb

## 2015-06-24 DIAGNOSIS — E162 Hypoglycemia, unspecified: Secondary | ICD-10-CM

## 2015-06-24 DIAGNOSIS — M545 Low back pain: Secondary | ICD-10-CM

## 2015-06-24 DIAGNOSIS — H542 Low vision, both eyes: Secondary | ICD-10-CM | POA: Diagnosis not present

## 2015-06-24 DIAGNOSIS — F411 Generalized anxiety disorder: Secondary | ICD-10-CM | POA: Diagnosis not present

## 2015-06-24 DIAGNOSIS — H543 Unqualified visual loss, both eyes: Secondary | ICD-10-CM

## 2015-06-24 DIAGNOSIS — G8929 Other chronic pain: Secondary | ICD-10-CM

## 2015-06-24 LAB — POCT GLYCOSYLATED HEMOGLOBIN (HGB A1C)

## 2015-06-24 MED ORDER — ALPRAZOLAM 0.5 MG PO TABS: 0.5000 mg | ORAL_TABLET | Freq: Two times a day (BID) | ORAL | Status: DC | PRN

## 2015-06-24 MED ORDER — TRAMADOL HCL 50 MG PO TABS: 50.0000 mg | ORAL_TABLET | Freq: Three times a day (TID) | ORAL | Status: DC | PRN

## 2015-06-24 NOTE — Progress Notes (Signed)
   Subjective:    Patient ID: Roberto Hanson, male    DOB: 1965-08-26, 50 y.o.   MRN: [id number]  HPI 50-year-old gentleman here for medicine refills. He takes alprazolam for chronic anxiety and tramadol for chronic back pain. There've been no new changes symptoms or problems. Symptoms are well managed with above medicines. We discussed potential lab work today he has no history of hypertension or lipid problems. There is been some suggestion in the past of diabetes given his obesity so we agreed to check an A1c today.  Patient Active Problem List   Diagnosis Date Noted  . Generalized anxiety disorder 11/03/2014  . Smoking addiction 11/03/2012  . Decreased vision in both eyes 11/03/2012  . Lumbago 11/03/2012   Outpatient Encounter Prescriptions as of 06/24/2015  Medication Sig  . ALPRAZolam (XANAX) 0.5 MG tablet Take 1 tablet (0.5 mg total) by mouth 2 (two) times daily as needed. for sleep  . traMADol (ULTRAM) 50 MG tablet Take 1 tablet (50 mg total) by mouth every 8 (eight) hours as needed.   No facility-administered encounter medications on file as of 06/24/2015.      Review of Systems  Constitutional: Negative.   Respiratory: Negative.   Cardiovascular: Negative.   Musculoskeletal: Positive for back pain.  Neurological: Negative.   Psychiatric/Behavioral: Negative.        Objective:   Physical Exam  Constitutional: He is oriented to person, place, and time. He appears well-developed and well-nourished.  Cardiovascular: Normal rate and regular rhythm.   Pulmonary/Chest: Effort normal and breath sounds normal.  Musculoskeletal: Normal range of motion.  Neurological: He is alert and oriented to person, place, and time.  Psychiatric: He has a normal mood and affect. His behavior is normal.          Assessment & Plan:  1. Hypoglycemia Once he is in the normal range. There is no evidence of blood sugar derangement - POCT glycosylated hemoglobin (Hb A1C)  2.  Generalized anxiety disorder Does well managed with alprazolam. Continue same  3. Decreased vision in both eyes No change noted.  4. Chronic bilateral low back pain without sciatica Are managed with tramadol Wardell Honour MD

## 2015-08-08 ENCOUNTER — Ambulatory Visit (INDEPENDENT_AMBULATORY_CARE_PROVIDER_SITE_OTHER): Payer: Medicare Other | Admitting: Family Medicine

## 2015-08-08 ENCOUNTER — Encounter: Payer: Self-pay | Admitting: Family Medicine

## 2015-08-08 ENCOUNTER — Encounter: Payer: Self-pay | Admitting: *Deleted

## 2015-08-08 VITALS — BP 106/67 | HR 86 | Temp 97.7°F | Ht 67.0 in | Wt 228.0 lb

## 2015-08-08 DIAGNOSIS — G8929 Other chronic pain: Secondary | ICD-10-CM | POA: Diagnosis not present

## 2015-08-08 DIAGNOSIS — M545 Low back pain: Secondary | ICD-10-CM | POA: Diagnosis not present

## 2015-08-08 NOTE — Progress Notes (Signed)
   Subjective:    Patient ID: Roberto Hanson, male    DOB: 04/22/1966, 50 y.o.   MRN: [id number]  HPI Patient here today for follow up on Back pain. He needs a note for a lower level apartment.  Back problem is chronic and related to prior injury. It has not changed recently. Patient tells me he will benefit from a lower level apartment due to issue with stairs.      Patient Active Problem List   Diagnosis Date Noted  . Generalized anxiety disorder 11/03/2014  . Smoking addiction 11/03/2012  . Decreased vision in both eyes 11/03/2012  . Lumbago 11/03/2012   Outpatient Encounter Prescriptions as of 08/08/2015  Medication Sig  . ALPRAZolam (XANAX) 0.5 MG tablet Take 1 tablet (0.5 mg total) by mouth 2 (two) times daily as needed. for sleep  . traMADol (ULTRAM) 50 MG tablet Take 1 tablet (50 mg total) by mouth every 8 (eight) hours as needed.  . ALPRAZolam (XANAX) 0.5 MG tablet Take 1 tablet (0.5 mg total) by mouth 2 (two) times daily as needed for anxiety. (Patient not taking: Reported on 08/08/2015)  . traMADol (ULTRAM) 50 MG tablet Take 1 tablet (50 mg total) by mouth every 8 (eight) hours as needed. (Patient not taking: Reported on 08/08/2015)  . traMADol (ULTRAM) 50 MG tablet Take 1 tablet (50 mg total) by mouth every 8 (eight) hours as needed. (Patient not taking: Reported on 08/08/2015)  . [DISCONTINUED] ALPRAZolam (XANAX) 0.5 MG tablet Take 1 tablet (0.5 mg total) by mouth 2 (two) times daily as needed for anxiety.   No facility-administered encounter medications on file as of 08/08/2015.      Review of Systems  Constitutional: Negative.   HENT: Negative.   Eyes: Negative.   Respiratory: Negative.   Cardiovascular: Negative.   Gastrointestinal: Negative.   Endocrine: Negative.   Genitourinary: Negative.   Musculoskeletal: Positive for back pain.  Skin: Negative.   Allergic/Immunologic: Negative.   Neurological: Negative.   Hematological: Negative.     Psychiatric/Behavioral: Negative.        Objective:   Physical Exam  Constitutional: He appears well-developed and well-nourished.  Musculoskeletal:  Back has decreased flexion. Right leg raising is negative. Reflexes are symmetric. There is good strength in the lower extremities.   BP 106/67 mmHg  Pulse 86  Temp(Src) 97.7 F (36.5 C) (Oral)  Ht 5\' 7"  (1.702 m)  Wt 228 lb (103.42 kg)  BMI 35.70 kg/m2       Assessment & Plan:  1. Chronic bilateral low back pain without sciatica She does okay with medications. He takes tramadol as needed. Provided note in an effort to help him acquire a apartment on the lower level.  Wardell Honour MD

## 2015-09-21 ENCOUNTER — Ambulatory Visit (INDEPENDENT_AMBULATORY_CARE_PROVIDER_SITE_OTHER): Payer: Medicare Other | Admitting: Family Medicine

## 2015-09-21 ENCOUNTER — Encounter: Payer: Self-pay | Admitting: Family Medicine

## 2015-09-21 VITALS — BP 94/62 | HR 68 | Temp 97.5°F | Ht 67.0 in | Wt 234.2 lb

## 2015-09-21 DIAGNOSIS — M549 Dorsalgia, unspecified: Secondary | ICD-10-CM | POA: Diagnosis not present

## 2015-09-21 DIAGNOSIS — G8929 Other chronic pain: Secondary | ICD-10-CM

## 2015-09-21 MED ORDER — HYDROCODONE-ACETAMINOPHEN 5-325 MG PO TABS: 1.0000 | ORAL_TABLET | Freq: Four times a day (QID) | ORAL | Status: DC | PRN

## 2015-09-21 NOTE — Progress Notes (Signed)
   Subjective:    Patient ID: Roberto Hanson, male    DOB: [birth year], 50 y.o.   MRN: [id number]  HPI 50 year old gentleman with chronic back pain. He has been doing some moving in his apartment recently and back pain seems worse. There is no real radicular symptom. It occasionally radiates around to the groin but mostly stays across his lower back. He has been taking tramadol before with success but it seems like the tramadol is not controlling his symptoms and pain currently.  Patient Active Problem List   Diagnosis Date Noted  . Generalized anxiety disorder 11/03/2014  . Smoking addiction 11/03/2012  . Decreased vision in both eyes 11/03/2012  . Lumbago 11/03/2012   Outpatient Encounter Prescriptions as of 09/21/2015  Medication Sig  . ALPRAZolam (XANAX) 0.5 MG tablet Take 1 tablet (0.5 mg total) by mouth 2 (two) times daily as needed. for sleep  . ALPRAZolam (XANAX) 0.5 MG tablet Take 1 tablet (0.5 mg total) by mouth 2 (two) times daily as needed for anxiety.  . traMADol (ULTRAM) 50 MG tablet Take 1 tablet (50 mg total) by mouth every 8 (eight) hours as needed.  . traMADol (ULTRAM) 50 MG tablet Take 1 tablet (50 mg total) by mouth every 8 (eight) hours as needed.  . traMADol (ULTRAM) 50 MG tablet Take 1 tablet (50 mg total) by mouth every 8 (eight) hours as needed.   No facility-administered encounter medications on file as of 09/21/2015.      Review of Systems  Constitutional: Negative.   Respiratory: Negative.   Cardiovascular: Negative.   Musculoskeletal: Positive for back pain.       Objective:   Physical Exam  Constitutional: He appears well-developed and well-nourished.  Musculoskeletal:  Back: Decreased flexion and extension. Straight leg raising is negative Deep tendon reflexes are 1+ but symmetric. Pain seems to localize over SI joints bilaterally.          Assessment & Plan:  1. Chronic back pain Jeani Hawking some time talking about treatment of back pain. I  went over some exercises which he could try also recommended heat and/or ice. Also recommended that OTC 10 June it. Will hold tramadol for now and use hydrocodone No. 40. I told him my expectation is that he would go back to the tramadol once we get him over this acute more severe episode.  Wardell Honour MD

## 2015-09-26 ENCOUNTER — Other Ambulatory Visit: Payer: Self-pay | Admitting: Family Medicine

## 2015-09-26 MED ORDER — TRAMADOL HCL 50 MG PO TABS: 50.0000 mg | ORAL_TABLET | Freq: Three times a day (TID) | ORAL | Status: DC | PRN

## 2015-09-26 MED ORDER — ALPRAZOLAM 0.5 MG PO TABS: 0.5000 mg | ORAL_TABLET | Freq: Two times a day (BID) | ORAL | Status: DC | PRN

## 2015-09-26 NOTE — Telephone Encounter (Signed)
Please get enough of these prescriptions for patient until Dr. Sabra Heck returns

## 2015-09-26 NOTE — Addendum Note (Signed)
Addended by: Zannie Cove on: 09/26/2015 03:05 PM   Modules accepted: Orders

## 2015-09-26 NOTE — Telephone Encounter (Signed)
Enough written for 3 days  - pt aware

## 2015-09-26 NOTE — Telephone Encounter (Signed)
Patient requesting refill from Dr. Sabra Heck. He will not return to office until Thursday. Please advise.

## 2015-09-29 ENCOUNTER — Ambulatory Visit: Payer: Medicare Other | Admitting: Family Medicine

## 2015-09-29 ENCOUNTER — Encounter (INDEPENDENT_AMBULATORY_CARE_PROVIDER_SITE_OTHER): Payer: Self-pay

## 2015-09-29 ENCOUNTER — Other Ambulatory Visit: Payer: Self-pay | Admitting: *Deleted

## 2015-09-29 ENCOUNTER — Telehealth: Payer: Self-pay | Admitting: Family Medicine

## 2015-09-29 MED ORDER — ALPRAZOLAM 0.5 MG PO TABS: 0.5000 mg | ORAL_TABLET | Freq: Two times a day (BID) | ORAL | Status: DC | PRN

## 2015-09-29 MED ORDER — TRAMADOL HCL 50 MG PO TABS
50.0000 mg | ORAL_TABLET | Freq: Three times a day (TID) | ORAL | Status: DC | PRN
Start: 2015-09-29 — End: 2016-03-02

## 2015-09-29 NOTE — Telephone Encounter (Signed)
Called pt told him its ready for him to pick

## 2015-09-29 NOTE — Telephone Encounter (Signed)
Pt came in today to pickup refills that we authorized by another provider on 4/10. Dr. Sabra Heck ok'd & printed refills for patients normal # of tablets.

## 2015-10-28 ENCOUNTER — Other Ambulatory Visit: Payer: Self-pay | Admitting: Family Medicine

## 2015-10-28 MED ORDER — HYDROCODONE-ACETAMINOPHEN 5-325 MG PO TABS
1.0000 | ORAL_TABLET | Freq: Four times a day (QID) | ORAL | Status: DC | PRN
Start: 2015-10-28 — End: 2015-11-28

## 2015-10-28 MED ORDER — ALPRAZOLAM 0.5 MG PO TABS: 0.5000 mg | ORAL_TABLET | Freq: Two times a day (BID) | ORAL | Status: DC | PRN

## 2015-10-28 NOTE — Telephone Encounter (Signed)
Xanax and tramadol filled 09/29/15. Hydrocodone filled 09/23/15. Last seen 09/21/15. All will print if approved

## 2015-11-25 ENCOUNTER — Other Ambulatory Visit: Payer: Self-pay | Admitting: Family Medicine

## 2015-11-25 NOTE — Telephone Encounter (Signed)
Last seen 06/24/15 Last Filled 10/28/15

## 2015-11-28 ENCOUNTER — Ambulatory Visit (INDEPENDENT_AMBULATORY_CARE_PROVIDER_SITE_OTHER): Payer: Medicare Other | Admitting: Family Medicine

## 2015-11-28 ENCOUNTER — Encounter: Payer: Self-pay | Admitting: Family Medicine

## 2015-11-28 VITALS — BP 87/59 | HR 65 | Temp 98.6°F | Ht 67.0 in | Wt 223.0 lb

## 2015-11-28 DIAGNOSIS — G8929 Other chronic pain: Secondary | ICD-10-CM | POA: Diagnosis not present

## 2015-11-28 DIAGNOSIS — M545 Low back pain, unspecified: Secondary | ICD-10-CM

## 2015-11-28 MED ORDER — HYDROCODONE-ACETAMINOPHEN 10-325 MG PO TABS: 1.0000 | ORAL_TABLET | Freq: Two times a day (BID) | ORAL | Status: DC | PRN

## 2015-11-28 NOTE — Progress Notes (Signed)
   Subjective:    Patient ID: Roberto Hanson, male    DOB: [birth year], 50 y.o.   MRN: [id number]  HPI Patient here today for medication follow up and pain management. He is accompanied today by his wife.  Pain is in his back and pelvis. He is requesting to increase hydrocodone from 5-10 mg and leave off tramadol that he has been taking for breakthrough pain. He thinks tramadol causes him to feel funny.   Patient Active Problem List   Diagnosis Date Noted  . Generalized anxiety disorder 11/03/2014  . Smoking addiction 11/03/2012  . Decreased vision in both eyes 11/03/2012  . Lumbago 11/03/2012   Outpatient Encounter Prescriptions as of 11/28/2015  Medication Sig  . ALPRAZolam (XANAX) 0.5 MG tablet TAKE 1 TABLET TWICE A DAY AS NEEDED AND FOR SLEEP  . HYDROcodone-acetaminophen (NORCO) 5-325 MG tablet Take 1 tablet by mouth every 6 (six) hours as needed for moderate pain.  . traMADol (ULTRAM) 50 MG tablet Take 1 tablet (50 mg total) by mouth every 8 (eight) hours as needed. (Patient not taking: Reported on 11/28/2015)   No facility-administered encounter medications on file as of 11/28/2015.      Review of Systems  Constitutional: Negative.   HENT: Negative.   Eyes: Negative.   Respiratory: Negative.   Cardiovascular: Negative.   Gastrointestinal: Negative.   Endocrine: Negative.   Genitourinary: Negative.   Musculoskeletal: Positive for back pain (chronic).  Skin: Negative.   Allergic/Immunologic: Negative.   Neurological: Negative.   Hematological: Negative.   Psychiatric/Behavioral: Positive for decreased concentration.       Objective:   Physical Exam  Constitutional: He is oriented to person, place, and time. He appears well-developed and well-nourished.  Cardiovascular: Normal rate and regular rhythm.   Pulmonary/Chest: Effort normal and breath sounds normal.  Neurological: He is alert and oriented to person, place, and time.  Psychiatric: He has a normal mood  and affect. His behavior is normal.   BP 87/59 mmHg  Pulse 65  Temp(Src) 98.6 F (37 C) (Oral)  Ht 5\' 7"  (1.702 m)  Wt 223 lb (101.152 kg)  BMI 34.92 kg/m2        Assessment & Plan:  1. Chronic bilateral low back pain without sciatica Will comply with patient wishes in increase hydrocodone from 5-10 mg twice a day. He was given enough for 3 months. No refills on tramadol now.  Wardell Honour MD

## 2015-12-27 ENCOUNTER — Other Ambulatory Visit: Payer: Self-pay | Admitting: *Deleted

## 2015-12-27 MED ORDER — ALPRAZOLAM 0.5 MG PO TABS: ORAL_TABLET | ORAL | Status: DC

## 2015-12-27 NOTE — Telephone Encounter (Signed)
Last filled 11/25/15, last seen 11/28/15. Call in at [facility]

## 2015-12-27 NOTE — Telephone Encounter (Signed)
Rx called in 

## 2015-12-27 NOTE — Telephone Encounter (Signed)
Last seen 11/28/15  Dr Sabra Heck  If approved route to nurse to call into [facility]

## 2016-02-01 ENCOUNTER — Telehealth: Payer: Self-pay | Admitting: Family Medicine

## 2016-02-01 NOTE — Telephone Encounter (Signed)
Scheduled

## 2016-03-02 ENCOUNTER — Ambulatory Visit (INDEPENDENT_AMBULATORY_CARE_PROVIDER_SITE_OTHER): Payer: Medicare Other | Admitting: Family Medicine

## 2016-03-02 ENCOUNTER — Encounter: Payer: Self-pay | Admitting: Family Medicine

## 2016-03-02 VITALS — BP 109/74 | HR 77 | Temp 97.1°F | Ht 67.0 in | Wt 223.0 lb

## 2016-03-02 DIAGNOSIS — G8929 Other chronic pain: Secondary | ICD-10-CM | POA: Diagnosis not present

## 2016-03-02 DIAGNOSIS — M545 Low back pain, unspecified: Secondary | ICD-10-CM

## 2016-03-02 DIAGNOSIS — F411 Generalized anxiety disorder: Secondary | ICD-10-CM | POA: Diagnosis not present

## 2016-03-02 MED ORDER — HYDROCODONE-ACETAMINOPHEN 10-325 MG PO TABS: 1.0000 | ORAL_TABLET | Freq: Two times a day (BID) | ORAL | 0 refills | Status: DC | PRN

## 2016-03-02 MED ORDER — ALPRAZOLAM 0.5 MG PO TABS: ORAL_TABLET | ORAL | 1 refills | Status: DC

## 2016-03-02 NOTE — Progress Notes (Signed)
   Subjective:    Patient ID: Roberto Hanson, male    DOB: Apr 16, 1966, 50 y.o.   MRN: [id number]  HPI 50 year old gentleman here to follow-up chronic back pain and anxiety. Current medications of hydrocodone 10 mg twice a day and alprazolam 0.5 mg once a day seem to be controlling his symptoms. He does not abuse medicine and takes as prescribed. There are no new symptoms or complaints today.  Patient Active Problem List   Diagnosis Date Noted  . Generalized anxiety disorder 11/03/2014  . Smoking addiction 11/03/2012  . Decreased vision in both eyes 11/03/2012  . Lumbago 11/03/2012   Outpatient Encounter Prescriptions as of 03/02/2016  Medication Sig  . ALPRAZolam (XANAX) 0.5 MG tablet TAKE 1 TABLET TWICE A DAY AS NEEDED AND FOR SLEEP  . HYDROcodone-acetaminophen (NORCO) 10-325 MG tablet Take 1 tablet by mouth every 12 (twelve) hours as needed.  Marland Kitchen HYDROcodone-acetaminophen (NORCO) 10-325 MG tablet Take 1 tablet by mouth every 12 (twelve) hours as needed.  Marland Kitchen HYDROcodone-acetaminophen (NORCO) 10-325 MG tablet Take 1 tablet by mouth every 12 (twelve) hours as needed.  . [DISCONTINUED] traMADol (ULTRAM) 50 MG tablet Take 1 tablet (50 mg total) by mouth every 8 (eight) hours as needed. (Patient not taking: Reported on 11/28/2015)   No facility-administered encounter medications on file as of 03/02/2016.       Review of Systems  Constitutional: Negative.   HENT: Negative.   Respiratory: Negative.   Cardiovascular: Negative.   Musculoskeletal: Positive for back pain.  Neurological: Negative.   Psychiatric/Behavioral: The patient is nervous/anxious.        Objective:   Physical Exam  Constitutional: He is oriented to person, place, and time. He appears well-developed and well-nourished.  Cardiovascular: Normal rate, regular rhythm and normal heart sounds.   Pulmonary/Chest: Effort normal and breath sounds normal.  Neurological: He is alert and oriented to person, place, and time.     BP 109/74   Pulse 77   Temp 97.1 F (36.2 C) (Oral)   Ht 5\' 7"  (1.702 m)   Wt 223 lb (101.2 kg)   BMI 34.93 kg/m         Assessment & Plan:  1. Generalized anxiety disorder Continue with Xanax as before   2. Chronic bilateral low back pain without sciatica Into new with hydrocodone as before  Wardell Honour MD

## 2016-04-03 ENCOUNTER — Telehealth: Payer: Self-pay | Admitting: Family Medicine

## 2016-04-04 NOTE — Telephone Encounter (Signed)
Refill at [facility], pt aware

## 2016-05-16 ENCOUNTER — Other Ambulatory Visit: Payer: Self-pay

## 2016-05-16 MED ORDER — ALPRAZOLAM 0.5 MG PO TABS: ORAL_TABLET | ORAL | 1 refills | Status: DC

## 2016-05-16 NOTE — Telephone Encounter (Signed)
Refill called to [facility] 

## 2016-07-26 ENCOUNTER — Other Ambulatory Visit: Payer: Self-pay

## 2016-07-26 MED ORDER — ALPRAZOLAM 0.5 MG PO TABS: ORAL_TABLET | ORAL | 0 refills | Status: DC

## 2016-07-27 ENCOUNTER — Telehealth: Payer: Self-pay | Admitting: Family Medicine

## 2016-07-27 NOTE — Telephone Encounter (Signed)
Pt NTBS to refill meds. Appt made for next week

## 2016-08-07 ENCOUNTER — Ambulatory Visit (INDEPENDENT_AMBULATORY_CARE_PROVIDER_SITE_OTHER): Payer: Medicare Other | Admitting: Family Medicine

## 2016-08-07 ENCOUNTER — Encounter: Payer: Self-pay | Admitting: Family Medicine

## 2016-08-07 VITALS — BP 107/73 | HR 72 | Temp 98.2°F | Ht 67.0 in | Wt 232.0 lb

## 2016-08-07 DIAGNOSIS — G8929 Other chronic pain: Secondary | ICD-10-CM | POA: Diagnosis not present

## 2016-08-07 DIAGNOSIS — F411 Generalized anxiety disorder: Secondary | ICD-10-CM

## 2016-08-07 DIAGNOSIS — M549 Dorsalgia, unspecified: Secondary | ICD-10-CM | POA: Diagnosis not present

## 2016-08-07 DIAGNOSIS — M545 Low back pain: Secondary | ICD-10-CM

## 2016-08-07 MED ORDER — HYDROCODONE-ACETAMINOPHEN 10-325 MG PO TABS: 1.0000 | ORAL_TABLET | Freq: Two times a day (BID) | ORAL | 0 refills | Status: DC | PRN

## 2016-08-07 MED ORDER — ALPRAZOLAM 0.5 MG PO TABS: ORAL_TABLET | ORAL | 1 refills | Status: DC

## 2016-08-07 NOTE — Patient Instructions (Signed)
Continue current medications. Continue good therapeutic lifestyle changes which include good diet and exercise. Fall precautions discussed with patient. If an FOBT was given today- please return it to our front desk. If you are over 50 years old - you may need Prevnar 13 or the adult Pneumonia vaccine.  **Flu shots are available--- please call and schedule a FLU-CLINIC appointment**  After your visit with us today you will receive a survey in the mail or online from [employer] regarding your care with us. Please take a moment to fill this out. Your feedback is very important to us as you can help us better understand your patient needs as well as improve your experience and satisfaction. WE CARE ABOUT YOU!!!    

## 2016-08-07 NOTE — Progress Notes (Signed)
   Subjective:    Patient ID: Roberto Hanson, male    DOB: 10-17-1965, 51 y.o.   MRN: [id number]  HPI Patient here today for follow up and medication refills. New symptoms today pain and neck right shoulder and down to forearm. Pain is not intractable and does not affect use or function on arm. Back pain is stable and in fact patient is requesting less pain medicine today. Continues to take Xanax 1 mg every morning, but sometimes twice a day.    Patient Active Problem List   Diagnosis Date Noted  . Generalized anxiety disorder 11/03/2014  . Smoking addiction 11/03/2012  . Decreased vision in both eyes 11/03/2012  . Lumbago 11/03/2012   Outpatient Encounter Prescriptions as of 08/07/2016  Medication Sig  . ALPRAZolam (XANAX) 0.5 MG tablet TAKE 1 TABLET TWICE A DAY AS NEEDED AND FOR SLEEP  . HYDROcodone-acetaminophen (NORCO) 10-325 MG tablet Take 1 tablet by mouth every 12 (twelve) hours as needed.  Marland Kitchen HYDROcodone-acetaminophen (NORCO) 10-325 MG tablet Take 1 tablet by mouth every 12 (twelve) hours as needed. (Patient not taking: Reported on 08/07/2016)  . HYDROcodone-acetaminophen (NORCO) 10-325 MG tablet Take 1 tablet by mouth every 12 (twelve) hours as needed. (Patient not taking: Reported on 08/07/2016)   No facility-administered encounter medications on file as of 08/07/2016.       Review of Systems  Constitutional: Negative.   HENT: Negative.   Eyes: Negative.   Respiratory: Negative.   Cardiovascular: Negative.   Gastrointestinal: Negative.   Endocrine: Negative.   Genitourinary: Negative.   Musculoskeletal: Positive for back pain and neck pain (some right arm pain).  Skin: Negative.   Allergic/Immunologic: Negative.   Neurological: Negative.   Hematological: Negative.   Psychiatric/Behavioral: Negative.        Objective:   Physical Exam  Constitutional: He is oriented to person, place, and time. He appears well-developed and well-nourished.  Musculoskeletal:  Normal range of motion. He exhibits no tenderness.  Neurological: He is alert and oriented to person, place, and time. He has normal reflexes.   BP 107/73 (BP Location: Left Arm)   Pulse 72   Temp 98.2 F (36.8 C) (Oral)   Ht 5\' 7"  (1.702 m)   Wt 232 lb (105.2 kg)   BMI 36.34 kg/m         Assessment & Plan:  1. Chronic back pain, unspecified back location, unspecified back pain laterality Refill hydrocodone. May also help neck pain. He was instructed if pain becomes intractable or interferes with function return for reexam and possible scan  2. GAD (generalized anxiety disorder) Refill Xanax as before.  Wardell Honour MD

## 2016-08-30 ENCOUNTER — Telehealth: Payer: Self-pay | Admitting: Family Medicine

## 2016-08-30 NOTE — Telephone Encounter (Signed)
Appt made

## 2016-08-31 ENCOUNTER — Ambulatory Visit: Payer: Medicare Other | Admitting: Family Medicine

## 2016-09-03 ENCOUNTER — Encounter: Payer: Self-pay | Admitting: Family Medicine

## 2016-09-03 ENCOUNTER — Ambulatory Visit (INDEPENDENT_AMBULATORY_CARE_PROVIDER_SITE_OTHER): Payer: Medicare Other | Admitting: Family Medicine

## 2016-09-03 VITALS — BP 110/71 | HR 82 | Temp 98.1°F | Ht 67.0 in | Wt 233.0 lb

## 2016-09-03 DIAGNOSIS — M542 Cervicalgia: Secondary | ICD-10-CM

## 2016-09-03 MED ORDER — ALPRAZOLAM 1 MG PO TABS: 1.0000 mg | ORAL_TABLET | Freq: Every evening | ORAL | 0 refills | Status: DC | PRN

## 2016-09-03 NOTE — Progress Notes (Signed)
   Subjective:    Patient ID: Roberto Hanson, male    DOB: 26-Nov-1965, 51 y.o.   MRN: [id number]  HPI Patient here today for follow up on anxiety meds and he is also having trouble with his right shoulder and arm.  He had requested 1 mg to take at night rather than 0.5 mg twice a day basically this amounts to 1 mg a day so I think he could go ahead and take what he has 0.58 S/P Also has some pain in his right arm with tingling. There is associated stiffness and pain in the trapezius muscle area in his neck and back.  Patient Active Problem List   Diagnosis Date Noted  . Generalized anxiety disorder 11/03/2014  . Smoking addiction 11/03/2012  . Decreased vision in both eyes 11/03/2012  . Lumbago 11/03/2012   Outpatient Encounter Prescriptions as of 09/03/2016  Medication Sig  . ALPRAZolam (XANAX) 0.5 MG tablet TAKE 1 TABLET TWICE A DAY AS NEEDED AND FOR SLEEP  . HYDROcodone-acetaminophen (NORCO) 10-325 MG tablet Take 1 tablet by mouth every 12 (twelve) hours as needed.  Marland Kitchen HYDROcodone-acetaminophen (NORCO) 10-325 MG tablet Take 1 tablet by mouth every 12 (twelve) hours as needed. (Patient not taking: Reported on 09/03/2016)  . HYDROcodone-acetaminophen (NORCO) 10-325 MG tablet Take 1 tablet by mouth every 12 (twelve) hours as needed. (Patient not taking: Reported on 09/03/2016)   No facility-administered encounter medications on file as of 09/03/2016.       Review of Systems  Constitutional: Negative.   HENT: Negative.   Eyes: Negative.   Respiratory: Negative.   Cardiovascular: Negative.   Gastrointestinal: Negative.   Endocrine: Negative.   Genitourinary: Negative.   Musculoskeletal: Positive for arthralgias (right shoulder and arm pain and numbness).  Skin: Negative.   Allergic/Immunologic: Negative.   Neurological: Negative.   Hematological: Negative.   Psychiatric/Behavioral: Negative.        Objective:   Physical Exam  Constitutional: He appears well-developed and  well-nourished.  Musculoskeletal:  Neck: Normal range of motion. Spurling's test is negative. Reflexes are symmetric as is strength in the upper extremities.   BP 110/71 (BP Location: Left Arm)   Pulse 82   Temp 98.1 F (36.7 C) (Oral)   Ht 5\' 7"  (1.702 m)   Wt 233 lb (105.7 kg)   BMI 36.49 kg/m         Assessment & Plan:  1. Neck pain Recommended muscle exercises for neck. No imaging at this time. Patient instructed if pain becomes intractable or he has problems with function of the arm get back in touch  Wardell Honour MD

## 2016-09-03 NOTE — Addendum Note (Signed)
Addended by: Zannie Cove on: 09/03/2016 02:20 PM   Modules accepted: Orders

## 2016-10-19 ENCOUNTER — Ambulatory Visit (INDEPENDENT_AMBULATORY_CARE_PROVIDER_SITE_OTHER): Payer: Medicare Other | Admitting: Physician Assistant

## 2016-10-19 ENCOUNTER — Encounter: Payer: Self-pay | Admitting: Physician Assistant

## 2016-10-19 VITALS — BP 117/78 | HR 91 | Temp 99.2°F | Ht 67.0 in | Wt 222.8 lb

## 2016-10-19 DIAGNOSIS — F411 Generalized anxiety disorder: Secondary | ICD-10-CM

## 2016-10-19 DIAGNOSIS — M545 Low back pain: Secondary | ICD-10-CM

## 2016-10-19 DIAGNOSIS — G8929 Other chronic pain: Secondary | ICD-10-CM | POA: Diagnosis not present

## 2016-10-19 MED ORDER — HYDROCODONE-ACETAMINOPHEN 10-325 MG PO TABS: 1.0000 | ORAL_TABLET | Freq: Two times a day (BID) | ORAL | 0 refills | Status: DC | PRN

## 2016-10-19 MED ORDER — ALPRAZOLAM 1 MG PO TABS: 1.0000 mg | ORAL_TABLET | Freq: Every evening | ORAL | 2 refills | Status: DC | PRN

## 2016-10-19 NOTE — Progress Notes (Signed)
BP 117/78   Pulse 91   Temp 99.2 F (37.3 C) (Oral)   Ht 5\' 7"  (1.702 m)   Wt 222 lb 12.8 oz (101.1 kg)   BMI 34.90 kg/m    Subjective:    Patient ID: Roberto Hanson, male    DOB: 15-Jan-1966, 51 y.o.   MRN: [id number]  HPI: Roberto Hanson is a 51 y.o. male presenting on 10/19/2016 for Medication Refill  This patient comes in for periodic recheck on medications and conditions including chronic back pain from back injury.  Anxiety and poor sleep most nights without alprazolam.  Overall he has been quite stable and no complications.   All medications are reviewed today. There are no reports of any problems with the medications. All of the medical conditions are reviewed and updated.  Lab work is reviewed and will be ordered as medically necessary. There are no new problems reported with today's visit.   Relevant past medical, surgical, family and social history reviewed and updated as indicated. Allergies and medications reviewed and updated.  Past Medical History:  Diagnosis Date  . Anxiety   . Chronic back pain   . OSA (obstructive sleep apnea)     Past Surgical History:  Procedure Laterality Date  . EYE SURGERY      Review of Systems  Constitutional: Negative.  Negative for appetite change and fatigue.  HENT: Negative.   Eyes: Negative.  Negative for pain and visual disturbance.  Respiratory: Negative.  Negative for cough, chest tightness, shortness of breath and wheezing.   Cardiovascular: Negative.  Negative for chest pain, palpitations and leg swelling.  Gastrointestinal: Negative.  Negative for abdominal pain, diarrhea, nausea and vomiting.  Endocrine: Negative.   Genitourinary: Negative.   Musculoskeletal: Positive for back pain.  Skin: Negative.  Negative for color change and rash.  Neurological: Negative.  Negative for weakness, numbness and headaches.  Psychiatric/Behavioral: The patient is nervous/anxious.     Allergies as of 10/19/2016   No Known  Allergies     Medication List       Accurate as of 10/19/16 12:51 PM. Always use your most recent med list.          ALPRAZolam 1 MG tablet Commonly known as:  XANAX Take 1 tablet (1 mg total) by mouth at bedtime as needed for anxiety.   HYDROcodone-acetaminophen 10-325 MG tablet Commonly known as:  NORCO Take 1 tablet by mouth every 12 (twelve) hours as needed.   HYDROcodone-acetaminophen 10-325 MG tablet Commonly known as:  NORCO Take 1 tablet by mouth every 12 (twelve) hours as needed.   HYDROcodone-acetaminophen 10-325 MG tablet Commonly known as:  NORCO Take 1 tablet by mouth every 12 (twelve) hours as needed.          Objective:    BP 117/78   Pulse 91   Temp 99.2 F (37.3 C) (Oral)   Ht 5\' 7"  (1.702 m)   Wt 222 lb 12.8 oz (101.1 kg)   BMI 34.90 kg/m   No Known Allergies  Physical Exam  Constitutional: He appears well-developed and well-nourished. No distress.  HENT:  Head: Normocephalic and atraumatic.  Eyes: Conjunctivae and EOM are normal. Pupils are equal, round, and reactive to light.  Cardiovascular: Normal rate, regular rhythm and normal heart sounds.   Pulmonary/Chest: Effort normal and breath sounds normal. No respiratory distress.  Skin: Skin is warm and dry.  Psychiatric: He has a normal mood and affect. His behavior is normal.  Nursing note  and vitals reviewed.   Results for orders placed or performed in visit on 06/24/15  POCT glycosylated hemoglobin (Hb A1C)  Result Value Ref Range   Hemoglobin A1C 5.4%       Assessment & Plan:   1. Generalized anxiety disorder  2. Chronic bilateral low back pain without sciatica   Current Outpatient Prescriptions:  .  ALPRAZolam (XANAX) 1 MG tablet, Take 1 tablet (1 mg total) by mouth at bedtime as needed for anxiety., Disp: 30 tablet, Rfl: 2 .  HYDROcodone-acetaminophen (NORCO) 10-325 MG tablet, Take 1 tablet by mouth every 12 (twelve) hours as needed., Disp: 30 tablet, Rfl: 0 .   HYDROcodone-acetaminophen (NORCO) 10-325 MG tablet, Take 1 tablet by mouth every 12 (twelve) hours as needed., Disp: 30 tablet, Rfl: 0 .  HYDROcodone-acetaminophen (NORCO) 10-325 MG tablet, Take 1 tablet by mouth every 12 (twelve) hours as needed., Disp: 30 tablet, Rfl: 0  Continue all other maintenance medications as listed above.  Follow up plan: Return in about 3 months (around 01/19/2017) for recheck.  Educational handout given for [place] PA-C [facility] [address]  [place], [place] [address] [phone]   10/19/2016, 12:51 PM

## 2016-10-19 NOTE — Patient Instructions (Signed)
In a few days you may receive a survey in the mail or online from [employer] regarding your visit with us today. Please take a moment to fill this out. Your feedback is very important to our whole office. It can help us better understand your needs as well as improve your experience and satisfaction. Thank you for taking your time to complete it. We care about you.  Sama Arauz, PA-C  

## 2016-11-12 ENCOUNTER — Other Ambulatory Visit: Payer: Self-pay | Admitting: Family Medicine

## 2016-11-13 NOTE — Telephone Encounter (Signed)
On 10/19/16 I did #30 with 2 refills. Not sure what refill is needed.

## 2016-11-14 NOTE — Telephone Encounter (Signed)
Xanax qty 30 and one refill called to [facility].

## 2016-11-14 NOTE — Telephone Encounter (Signed)
There were 2 refills on the script, therefore he should be able to get them 6/3 and 7/2 or so.

## 2016-11-14 NOTE — Telephone Encounter (Signed)
See my note about filling on 10/19/16

## 2016-11-14 NOTE — Telephone Encounter (Signed)
Was filled in 5/4

## 2016-11-16 ENCOUNTER — Ambulatory Visit (INDEPENDENT_AMBULATORY_CARE_PROVIDER_SITE_OTHER): Payer: Medicare Other | Admitting: *Deleted

## 2016-11-16 VITALS — BP 105/60 | HR 70 | Ht 67.0 in | Wt 222.0 lb

## 2016-11-16 DIAGNOSIS — Z0000 Encounter for general adult medical examination without abnormal findings: Secondary | ICD-10-CM

## 2016-11-16 DIAGNOSIS — Z1211 Encounter for screening for malignant neoplasm of colon: Secondary | ICD-10-CM

## 2016-11-16 DIAGNOSIS — Z23 Encounter for immunization: Secondary | ICD-10-CM | POA: Diagnosis not present

## 2016-11-16 NOTE — Patient Instructions (Signed)
[name] ,  Thank you for taking time to come for your Medicare Wellness Visit. I appreciate your ongoing commitment to your health goals. Please review the following plan we discussed and let me know if I can assist you in the future.   These are the goals we discussed: Goals    . Exercise 3x per week (30 min per time)          Continue to exercise for at least 30 minutes daily.       This is a list of the screening recommended for you and due dates:  Health Maintenance  Topic Date Due  . HIV Screening  04/14/1981  . Tetanus Vaccine  04/14/1985  . Colon Cancer Screening  04/14/2016  . Flu Shot  01/16/2017    Tdap Vaccine (Tetanus, Diphtheria and Pertussis): What You Need to Know 1. Why get vaccinated? Tetanus, diphtheria and pertussis are very serious diseases. Tdap vaccine can protect [place] from these diseases. And, Tdap vaccine given to pregnant women can protect newborn babies against pertussis. TETANUS (Lockjaw) is rare in the [place] today. It causes painful muscle tightening and stiffness, usually all over the body.  It can lead to tightening of muscles in the head and neck so you can't open your mouth, swallow, or sometimes even breathe. Tetanus kills about 1 out of 10 people who are infected even after receiving the best medical care.  DIPHTHERIA is also rare in the [place] today. It can cause a thick coating to form in the back of the throat.  It can lead to breathing problems, heart failure, paralysis, and death.  PERTUSSIS (Whooping Cough) causes severe coughing spells, which can cause difficulty breathing, vomiting and disturbed sleep.  It can also lead to weight loss, incontinence, and rib fractures. Up to 2 in 100 adolescents and 5 in 100 adults with pertussis are hospitalized or have complications, which could include pneumonia or death.  These diseases are caused by bacteria. Diphtheria and pertussis are spread from person to person through secretions  from coughing or sneezing. Tetanus enters the body through cuts, scratches, or wounds. Before vaccines, as many as 200,000 cases of diphtheria, 200,000 cases of pertussis, and hundreds of cases of tetanus, were reported in the [place] each year. Since vaccination began, reports of cases for tetanus and diphtheria have dropped by about 99% and for pertussis by about 80%. 2. Tdap vaccine Tdap vaccine can protect adolescents and adults from tetanus, diphtheria, and pertussis. One dose of Tdap is routinely given at age 51 or 17. People who did not get Tdap at that age should get it as soon as possible. Tdap is especially important for healthcare professionals and anyone having close contact with a baby younger than 12 months. Pregnant women should get a dose of Tdap during every pregnancy, to protect the newborn from pertussis. Infants are most at risk for severe, life-threatening complications from pertussis. Another vaccine, called Td, protects against tetanus and diphtheria, but not pertussis. A Td booster should be given every 10 years. Tdap may be given as one of these boosters if you have never gotten Tdap before. Tdap may also be given after a severe cut or burn to prevent tetanus infection. Your doctor or the person giving you the vaccine can give you more information. Tdap may safely be given at the same time as other vaccines. 3. Some people should not get this vaccine  A person who has ever had a life-threatening allergic reaction  after a previous dose of any diphtheria, tetanus or pertussis containing vaccine, OR has a severe allergy to any part of this vaccine, should not get Tdap vaccine. Tell the person giving the vaccine about any severe allergies.  Anyone who had coma or long repeated seizures within 7 days after a childhood dose of DTP or DTaP, or a previous dose of Tdap, should not get Tdap, unless a cause other than the vaccine was found. They can still get Td.  Talk to your  doctor if you: ? have seizures or another nervous system problem, ? had severe pain or swelling after any vaccine containing diphtheria, tetanus or pertussis, ? ever had a condition called Guillain-Barr Syndrome (GBS), ? aren't feeling well on the day the shot is scheduled. 4. Risks With any medicine, including vaccines, there is a chance of side effects. These are usually mild and go away on their own. Serious reactions are also possible but are rare. Most people who get Tdap vaccine do not have any problems with it. Mild problems following Tdap: (Did not interfere with activities)  Pain where the shot was given (about 3 in 4 adolescents or 2 in 3 adults)  Redness or swelling where the shot was given (about 1 person in 5)  Mild fever of at least 100.110F (up to about 1 in 25 adolescents or 1 in 100 adults)  Headache (about 3 or 4 people in 10)  Tiredness (about 1 person in 3 or 4)  Nausea, vomiting, diarrhea, stomach ache (up to 1 in 4 adolescents or 1 in 10 adults)  Chills, sore joints (about 1 person in 10)  Body aches (about 1 person in 3 or 4)  Rash, swollen glands (uncommon)  Moderate problems following Tdap: (Interfered with activities, but did not require medical attention)  Pain where the shot was given (up to 1 in 5 or 6)  Redness or swelling where the shot was given (up to about 1 in 16 adolescents or 1 in 12 adults)  Fever over 102F (about 1 in 100 adolescents or 1 in 250 adults)  Headache (about 1 in 7 adolescents or 1 in 10 adults)  Nausea, vomiting, diarrhea, stomach ache (up to 1 or 3 people in 100)  Swelling of the entire arm where the shot was given (up to about 1 in 500).  Severe problems following Tdap: (Unable to perform usual activities; required medical attention)  Swelling, severe pain, bleeding and redness in the arm where the shot was given (rare).  Problems that could happen after any vaccine:  People sometimes faint after a medical  procedure, including vaccination. Sitting or lying down for about 15 minutes can help prevent fainting, and injuries caused by a fall. Tell your doctor if you feel dizzy, or have vision changes or ringing in the ears.  Some people get severe pain in the shoulder and have difficulty moving the arm where a shot was given. This happens very rarely.  Any medication can cause a severe allergic reaction. Such reactions from a vaccine are very rare, estimated at fewer than 1 in a million doses, and would happen within a few minutes to a few hours after the vaccination. As with any medicine, there is a very remote chance of a vaccine causing a serious injury or death. The safety of vaccines is always being monitored. For more information, visit: http://www.aguilar.org/ 5. What if there is a serious problem? What should I look for? Look for anything that concerns you, such as  signs of a severe allergic reaction, very high fever, or unusual behavior. Signs of a severe allergic reaction can include hives, swelling of the face and throat, difficulty breathing, a fast heartbeat, dizziness, and weakness. These would usually start a few minutes to a few hours after the vaccination. What should I do?  If you think it is a severe allergic reaction or other emergency that can't wait, call 9-1-1 or get the person to the nearest hospital. Otherwise, call your doctor.  Afterward, the reaction should be reported to the Vaccine Adverse Event Reporting System (VAERS). Your doctor might file this report, or you can do it yourself through the VAERS web site at www.vaers.SamedayNews.es, or by calling 484-457-8019. ? VAERS does not give medical advice. 6. The National Vaccine Injury Compensation Program The [employer] Vaccine Injury Compensation Program (VICP) is a federal program that was created to compensate people who may have been injured by certain vaccines. Persons who believe they may have been injured by a vaccine can  learn about the program and about filing a claim by calling 6182592173 or visiting the [place] website at GoldCloset.com.ee. There is a time limit to file a claim for compensation. 7. How can I learn more?  Ask your doctor. He or she can give you the vaccine package insert or suggest other sources of information.  Call your local or state health department.  Contact the Centers for Disease Control and Prevention (CDC): ? Call 417-030-7457 (1-800-CDC-INFO) or ? Visit CDC's website at http://hunter.com/ CDC Tdap Vaccine VIS (08/11/13) This information is not intended to replace advice given to you by your health care provider. Make sure you discuss any questions you have with your health care provider. Document Released: 12/04/2011 Document Revised: 02/23/2016 Document Reviewed: 02/23/2016 Elsevier Interactive Patient Education  2017 [employer].

## 2016-11-19 ENCOUNTER — Encounter: Payer: Self-pay | Admitting: Physician Assistant

## 2016-11-19 ENCOUNTER — Encounter (INDEPENDENT_AMBULATORY_CARE_PROVIDER_SITE_OTHER): Payer: Self-pay | Admitting: *Deleted

## 2016-11-19 NOTE — Progress Notes (Addendum)
Subjective:   Roberto Hanson is a 51 y.o. male who presents for an Initial Medicare Annual Wellness Visit. [name] lives in a ground floor apartment alone. He is visually impaired. He has friends and family that live nearby and has a good support system. He has one daughter and two grandchildren. [name] enjoys playing the drums and walks for exercise regularly.   Review of Systems   Cardiac Risk Factors include: male gender;obesity (BMI >30kg/m2);smoking/ tobacco exposure  Other systems negative.   Objective:    Today's Vitals   11/16/16 1625  BP: 105/60  Pulse: 70  Weight: 222 lb (100.7 kg)  Height: 5\' 7"  (1.702 m)   Body mass index is 34.77 kg/m.  Current Medications (verified) Outpatient Encounter Prescriptions as of 11/16/2016  Medication Sig  . ALPRAZolam (XANAX) 1 MG tablet Take 1 tablet (1 mg total) by mouth at bedtime as needed for anxiety.  Marland Kitchen HYDROcodone-acetaminophen (NORCO) 10-325 MG tablet Take 1 tablet by mouth every 12 (twelve) hours as needed.  Marland Kitchen HYDROcodone-acetaminophen (NORCO) 10-325 MG tablet Take 1 tablet by mouth every 12 (twelve) hours as needed.  Marland Kitchen HYDROcodone-acetaminophen (NORCO) 10-325 MG tablet Take 1 tablet by mouth every 12 (twelve) hours as needed.   No facility-administered encounter medications on file as of 11/16/2016.     Allergies (verified) Patient has no known allergies.   History: Past Medical History:  Diagnosis Date  . Anxiety   . Chronic back pain   . OSA (obstructive sleep apnea)    Past Surgical History:  Procedure Laterality Date  . EYE SURGERY     Family History  Problem Relation Age of Onset  . Diabetes Mother   . Diabetes Father   . Diabetes Brother   . Glaucoma Sister   . Healthy Daughter   . Glaucoma Brother   . Diabetes Brother   . Glaucoma Sister    Social History   Occupational History  . Not on file.   Social History Main Topics  . Smoking status: Current Every Day Smoker    Packs/day: 0.25    Years: 34.00    Types: Cigarettes    Last attempt to quit: 10/04/2014  . Smokeless tobacco: Never Used  . Alcohol use Yes     Comment: occasional  . Drug use: No  . Sexual activity: Not Currently   Tobacco Counseling Ready to quit: Yes Counseling given: No   Activities of Daily Living In your present state of health, do you have any difficulty performing the following activities: 11/16/2016  Hearing? Y  Vision? Y  Difficulty concentrating or making decisions? N  Walking or climbing stairs? N  Dressing or bathing? N  Doing errands, shopping? N  Preparing Food and eating ? N  Using the Toilet? N  In the past six months, have you accidently leaked urine? N  Do you have problems with loss of bowel control? N  Managing your Medications? N  Managing your Finances? N  Housekeeping or managing your Housekeeping? N  Some recent data might be hidden  He has noticed some some decrease in his hearing but it doesn't impact his daily life.  He is visually impaired but is able to live on his own effectively.   Immunizations and Health Maintenance Immunization History  Administered Date(s) Administered  . Tdap 11/16/2016   Health Maintenance Due  Topic Date Due  . HIV Screening  04/14/1981  . COLONOSCOPY  04/14/2016    Patient Care Team: Particia Nearing  S, PA-C as PCP - General (Physician Assistant)  Denies any hospitalizations, ER visits, or surgeries within the past year.    Assessment:   This is a routine wellness examination for Roberto Hanson.   Hearing/Vision screen No hearing deficits noted.  Patient is unable to write but can read if the print is large and brings it to almost touching his face.    Dietary issues and exercise activities discussed: Current Exercise Habits: Home exercise routine, Type of exercise: walking, Time (Minutes): 60, Frequency (Times/Week): 3, Weekly Exercise (Minutes/Week): 180, Intensity: Moderate, Exercise limited by: Other - see comments (visual  limitations)  Goals    . Exercise 3x per week (30 min per time)          Continue to exercise for at least 30 minutes daily.      Diet He eats 2 meals per day and has 1-2 snacks. He drinks a lot of water.  Depression Screen PHQ 2/9 Scores 11/16/2016 10/19/2016 09/03/2016 08/07/2016  PHQ - 2 Score 0 0 0 0    Fall Risk Fall Risk  11/16/2016 10/19/2016 09/03/2016 08/07/2016 11/28/2015  Falls in the past year? No No No No No    Cognitive Function: MMSE - Mini Mental State Exam 11/16/2016  Orientation to time 5  Orientation to Place 5  Registration 3  Attention/ Calculation 5  Recall 3  Language- name 2 objects 2  Language- repeat 1  Language- follow 3 step command 3  Language- read & follow direction 1  Write a sentence (No Data)  Write a sentence-comments unable to complete due to visual limitations  Copy design (No Data)  Copy design-comments unable to complete due to visual limitations  Testing limited but no apparent deficit      Screening Tests Health Maintenance  Topic Date Due  . HIV Screening  04/14/1981  . COLONOSCOPY  04/14/2016  . INFLUENZA VACCINE  01/16/2017  . TETANUS/TDAP  11/17/2026        Plan:  Tdap given today  Advance directives given and explained. Patient will bring a signed/notarized copy back to our office Referral to GI for colonoscopy Keep f/u appointment with Particia Nearing, PA-C on 01/22/17   I have personally reviewed and noted the following in the patient's chart:   . Medical and social history . Use of alcohol, tobacco or illicit drugs  . Current medications and supplements . Functional ability and status . Nutritional status . Physical activity . Advanced directives . List of other physicians . Hospitalizations, surgeries, and ER visits in previous 12 months . Vitals . Screenings to include cognitive, depression, and falls . Referrals and appointments  In addition, I have reviewed and discussed with patient certain preventive protocols,  quality metrics, and best practice recommendations. A written personalized care plan for preventive services as well as general preventive health recommendations were provided to patient.    Chong Sicilian, RN  11/19/2016   I have reviewed and agree with the above AWV documentation.   [name] PA-C [facility] [address]  [place], [place] [address] [phone]

## 2017-01-22 ENCOUNTER — Ambulatory Visit: Payer: Medicare Other | Admitting: Physician Assistant

## 2017-01-22 ENCOUNTER — Other Ambulatory Visit: Payer: Self-pay | Admitting: Physician Assistant

## 2017-01-23 NOTE — Telephone Encounter (Signed)
Last seen 10/19/16  Roberto Hanson  If approved route to nurse to call into [facility]

## 2017-01-23 NOTE — Telephone Encounter (Signed)
Called to [facility] 

## 2017-01-30 ENCOUNTER — Ambulatory Visit (INDEPENDENT_AMBULATORY_CARE_PROVIDER_SITE_OTHER): Payer: Medicare Other | Admitting: Physician Assistant

## 2017-01-30 ENCOUNTER — Encounter: Payer: Self-pay | Admitting: Physician Assistant

## 2017-01-30 VITALS — BP 106/67 | HR 74 | Temp 98.8°F | Ht 67.0 in | Wt 223.8 lb

## 2017-01-30 DIAGNOSIS — R5383 Other fatigue: Secondary | ICD-10-CM

## 2017-01-30 DIAGNOSIS — Z1211 Encounter for screening for malignant neoplasm of colon: Secondary | ICD-10-CM

## 2017-01-30 DIAGNOSIS — Z0000 Encounter for general adult medical examination without abnormal findings: Secondary | ICD-10-CM

## 2017-01-30 DIAGNOSIS — G8929 Other chronic pain: Secondary | ICD-10-CM | POA: Diagnosis not present

## 2017-01-30 DIAGNOSIS — M545 Low back pain: Secondary | ICD-10-CM

## 2017-01-30 DIAGNOSIS — F411 Generalized anxiety disorder: Secondary | ICD-10-CM | POA: Diagnosis not present

## 2017-01-30 MED ORDER — HYDROCODONE-ACETAMINOPHEN 10-325 MG PO TABS: 1.0000 | ORAL_TABLET | Freq: Two times a day (BID) | ORAL | 0 refills | Status: DC | PRN

## 2017-01-30 MED ORDER — ALPRAZOLAM 1 MG PO TABS: 1.0000 mg | ORAL_TABLET | Freq: Two times a day (BID) | ORAL | 5 refills | Status: DC | PRN

## 2017-01-30 NOTE — Patient Instructions (Signed)
In a few days you may receive a survey in the mail or online from [employer] regarding your visit with us today. Please take a moment to fill this out. Your feedback is very important to our whole office. It can help us better understand your needs as well as improve your experience and satisfaction. Thank you for taking your time to complete it. We care about you.  Dorthy Hustead, PA-C  

## 2017-01-30 NOTE — Progress Notes (Signed)
BP 106/67   Pulse 74   Temp 98.8 F (37.1 C) (Oral)   Ht 5' 7" (1.702 m)   Wt 223 lb 12.8 oz (101.5 kg)   BMI 35.05 kg/m    Subjective:    Patient ID: Roberto Hanson, male    DOB: 1965/08/11, 51 y.o.   MRN: [id number]  HPI: Roberto Hanson is a 51 y.o. male presenting on 01/30/2017 for Follow-up (3 month )    Relevant past medical, surgical, family and social history reviewed and updated as indicated. Allergies and medications reviewed and updated.  Past Medical History:  Diagnosis Date  . Anxiety   . Chronic back pain   . OSA (obstructive sleep apnea)     Past Surgical History:  Procedure Laterality Date  . EYE SURGERY      Review of Systems  Constitutional: Positive for fatigue and unexpected weight change. Negative for activity change and appetite change.  HENT: Negative.   Eyes: Negative.  Negative for pain and visual disturbance.  Respiratory: Negative.  Negative for cough, chest tightness, shortness of breath and wheezing.   Cardiovascular: Negative.  Negative for chest pain, palpitations and leg swelling.  Gastrointestinal: Negative.  Negative for abdominal pain, diarrhea, nausea and vomiting.  Endocrine: Negative.   Genitourinary: Negative.   Musculoskeletal: Positive for arthralgias and back pain.  Skin: Negative.  Negative for color change and rash.  Neurological: Positive for weakness. Negative for numbness and headaches.  Psychiatric/Behavioral: Positive for sleep disturbance. Negative for decreased concentration. The patient is nervous/anxious.     Allergies as of 01/30/2017   No Known Allergies     Medication List       Accurate as of 01/30/17  9:09 PM. Always use your most recent med list.          ALPRAZolam 1 MG tablet Commonly known as:  XANAX Take 1 tablet (1 mg total) by mouth 2 (two) times daily as needed.   HYDROcodone-acetaminophen 10-325 MG tablet Commonly known as:  NORCO Take 1 tablet by mouth every 12 (twelve) hours  as needed.   HYDROcodone-acetaminophen 10-325 MG tablet Commonly known as:  NORCO Take 1 tablet by mouth every 12 (twelve) hours as needed.   HYDROcodone-acetaminophen 10-325 MG tablet Commonly known as:  NORCO Take 1 tablet by mouth every 12 (twelve) hours as needed.          Objective:    BP 106/67   Pulse 74   Temp 98.8 F (37.1 C) (Oral)   Ht 5' 7" (1.702 m)   Wt 223 lb 12.8 oz (101.5 kg)   BMI 35.05 kg/m   No Known Allergies  Physical Exam  Constitutional: He appears well-developed and well-nourished.  HENT:  Head: Normocephalic and atraumatic.  Eyes: Pupils are equal, round, and reactive to light. Conjunctivae and EOM are normal.  Neck: Normal range of motion. Neck supple.  Cardiovascular: Normal rate, regular rhythm and normal heart sounds.   Pulmonary/Chest: Effort normal and breath sounds normal.  Abdominal: Soft. Bowel sounds are normal.  Musculoskeletal: Normal range of motion.  Skin: Skin is warm and dry.    Results for orders placed or performed in visit on 06/24/15  POCT glycosylated hemoglobin (Hb A1C)  Result Value Ref Range   Hemoglobin A1C 5.4%       Assessment & Plan:   1. Chronic bilateral low back pain without sciatica  2. Generalized anxiety disorder  3. Well adult exam - CMP14+EGFR; Future - CBC with Differential/Platelet;  Future - TSH; Future - Lipid panel; Future - PSA; Future - Testosterone; Future  4. Fatigue, unspecified type - Testosterone; Future    Current Outpatient Prescriptions:  .  ALPRAZolam (XANAX) 1 MG tablet, Take 1 tablet (1 mg total) by mouth 2 (two) times daily as needed., Disp: 60 tablet, Rfl: 5 .  HYDROcodone-acetaminophen (NORCO) 10-325 MG tablet, Take 1 tablet by mouth every 12 (twelve) hours as needed., Disp: 30 tablet, Rfl: 0 .  HYDROcodone-acetaminophen (NORCO) 10-325 MG tablet, Take 1 tablet by mouth every 12 (twelve) hours as needed., Disp: 30 tablet, Rfl: 0 .  HYDROcodone-acetaminophen (NORCO)  10-325 MG tablet, Take 1 tablet by mouth every 12 (twelve) hours as needed., Disp: 30 tablet, Rfl: 0 Continue all other maintenance medications as listed above.  Follow up plan: Return in about 3 months (around 05/02/2017) for recheck.  Educational handout given for [place] PA-C [place] [address]  [place], [place] [address] [phone]   01/30/2017, 9:09 PM

## 2017-02-07 ENCOUNTER — Other Ambulatory Visit: Payer: Medicare Other

## 2017-02-07 ENCOUNTER — Telehealth: Payer: Self-pay

## 2017-02-07 DIAGNOSIS — Z0000 Encounter for general adult medical examination without abnormal findings: Secondary | ICD-10-CM

## 2017-02-07 DIAGNOSIS — R5383 Other fatigue: Secondary | ICD-10-CM

## 2017-02-07 NOTE — Telephone Encounter (Signed)
[phone] patient received letter to schedule tcs

## 2017-02-08 LAB — CMP14+EGFR
ALBUMIN: 4.2 g/dL (ref 3.5–5.5)
ALK PHOS: 53 IU/L (ref 39–117)
ALT: 31 IU/L (ref 0–44)
AST: 16 IU/L (ref 0–40)
Albumin/Globulin Ratio: 1.7 (ref 1.2–2.2)
BILIRUBIN TOTAL: 0.9 mg/dL (ref 0.0–1.2)
BUN / CREAT RATIO: 14 (ref 9–20)
BUN: 13 mg/dL (ref 6–24)
CHLORIDE: 104 mmol/L (ref 96–106)
CO2: 22 mmol/L (ref 20–29)
Calcium: 8.8 mg/dL (ref 8.7–10.2)
Creatinine, Ser: 0.93 mg/dL (ref 0.76–1.27)
GFR calc Af Amer: 110 mL/min/{1.73_m2} (ref >59)
GFR calc non Af Amer: 95 mL/min/{1.73_m2} (ref >59)
GLUCOSE: 101 mg/dL — AB (ref 65–99)
Globulin, Total: 2.5 g/dL (ref 1.5–4.5)
Potassium: 4.1 mmol/L (ref 3.5–5.2)
Sodium: 140 mmol/L (ref 134–144)
Total Protein: 6.7 g/dL (ref 6.0–8.5)

## 2017-02-08 LAB — CBC WITH DIFFERENTIAL/PLATELET
BASOS ABS: 0 10*3/uL (ref 0.0–0.2)
BASOS: 0 %
EOS (ABSOLUTE): 0.1 10*3/uL (ref 0.0–0.4)
Eos: 1 %
HEMOGLOBIN: 14.9 g/dL (ref 13.0–17.7)
Hematocrit: 42.9 % (ref 37.5–51.0)
IMMATURE GRANS (ABS): 0 10*3/uL (ref 0.0–0.1)
IMMATURE GRANULOCYTES: 0 %
LYMPHS: 32 %
Lymphocytes Absolute: 1.9 10*3/uL (ref 0.7–3.1)
MCH: 32 pg (ref 26.6–33.0)
MCHC: 34.7 g/dL (ref 31.5–35.7)
MCV: 92 fL (ref 79–97)
MONOCYTES: 9 %
Monocytes Absolute: 0.5 10*3/uL (ref 0.1–0.9)
NEUTROS ABS: 3.3 10*3/uL (ref 1.4–7.0)
NEUTROS PCT: 58 %
PLATELETS: 148 10*3/uL — AB (ref 150–379)
RBC: 4.66 x10E6/uL (ref 4.14–5.80)
RDW: 13 % (ref 12.3–15.4)
WBC: 5.8 10*3/uL (ref 3.4–10.8)

## 2017-02-08 LAB — LIPID PANEL
CHOL/HDL RATIO: 4 ratio (ref 0.0–5.0)
CHOLESTEROL TOTAL: 149 mg/dL (ref 100–199)
HDL: 37 mg/dL — ABNORMAL LOW (ref >39)
LDL CALC: 88 mg/dL (ref 0–99)
Triglycerides: 118 mg/dL (ref 0–149)
VLDL Cholesterol Cal: 24 mg/dL (ref 5–40)

## 2017-02-08 LAB — PSA: Prostate Specific Ag, Serum: 1.1 ng/mL (ref 0.0–4.0)

## 2017-02-08 LAB — TSH: TSH: 1.82 u[IU]/mL (ref 0.450–4.500)

## 2017-02-08 LAB — TESTOSTERONE: TESTOSTERONE: 689 ng/dL (ref 264–916)

## 2017-02-12 ENCOUNTER — Telehealth: Payer: Self-pay

## 2017-02-12 NOTE — Telephone Encounter (Signed)
See separate triage.  

## 2017-02-12 NOTE — Telephone Encounter (Signed)
Gastroenterology Pre-Procedure Review  Request Date: 02/12/2017 Requesting Physician: Particia Nearing  PATIENT REVIEW QUESTIONS: The patient responded to the following health history questions as indicated:    This will be pt's first colonoscopy  1. Diabetes Melitis: no 2. Joint replacements in the past 12 months: no 3. Major health problems in the past 3 months: no 4. Has an artificial valve or MVP: no 5. Has a defibrillator: no 6. Has been advised in past to take antibiotics in advance of a procedure like teeth cleaning: no 7. Family history of colon cancer: no  8. Alcohol Use: no 9. History of sleep apnea: HAS C PAP 10. History of coronary artery or other vascular stents placed within the last 12 months: no 11. History of any prior anesthesia complications: no    MEDICATIONS & ALLERGIES:    Patient reports the following regarding taking any blood thinners:   Plavix? no Aspirin? no Coumadin? no Brilinta? no Xarelto? no Eliquis? no Pradaxa? no Savaysa? no Effient? no  Patient confirms/reports the following medications:  Current Outpatient Prescriptions  Medication Sig Dispense Refill  . HYDROcodone-acetaminophen (NORCO) 10-325 MG tablet Take 1 tablet by mouth every 12 (twelve) hours as needed. 30 tablet 0  . ALPRAZolam (XANAX) 1 MG tablet Take 1 tablet (1 mg total) by mouth 2 (two) times daily as needed. 60 tablet 5   No current facility-administered medications for this visit.     Patient confirms/reports the following allergies:  No Known Allergies  No orders of the defined types were placed in this encounter.   AUTHORIZATION INFORMATION Primary Insurance:   ID #:  Group #:  Pre-Cert / Auth required:  Pre-Cert / Auth #:   Secondary Insurance:   ID #:   Group #:  Pre-Cert / Auth required:  Pre-Cert / Auth #:   SCHEDULE INFORMATION: Procedure has been scheduled as follows:  Date: Time:   Location:  This Gastroenterology Pre-Precedure Review Form is being  routed to the following provider(s): R. Garfield Cornea, MD

## 2017-02-13 NOTE — Telephone Encounter (Signed)
The patient will need an OV to schedule due to Xanax and Hydrocodone.

## 2017-02-14 NOTE — Telephone Encounter (Signed)
Pt is aware and is scheduled an OV with Neil Crouch, PA on 04/05/2017 at 11:00 AM.

## 2017-02-14 NOTE — Telephone Encounter (Signed)
LMOM to call.

## 2017-02-22 ENCOUNTER — Encounter: Payer: Self-pay | Admitting: Gastroenterology

## 2017-04-03 ENCOUNTER — Encounter: Payer: Self-pay | Admitting: Physician Assistant

## 2017-04-03 ENCOUNTER — Ambulatory Visit (INDEPENDENT_AMBULATORY_CARE_PROVIDER_SITE_OTHER): Payer: Medicare Other | Admitting: Physician Assistant

## 2017-04-03 VITALS — BP 116/70 | HR 85 | Temp 98.8°F | Ht 67.0 in | Wt 228.0 lb

## 2017-04-03 DIAGNOSIS — G8929 Other chronic pain: Secondary | ICD-10-CM | POA: Diagnosis not present

## 2017-04-03 DIAGNOSIS — M545 Low back pain, unspecified: Secondary | ICD-10-CM

## 2017-04-03 DIAGNOSIS — F411 Generalized anxiety disorder: Secondary | ICD-10-CM

## 2017-04-03 DIAGNOSIS — M542 Cervicalgia: Secondary | ICD-10-CM

## 2017-04-03 MED ORDER — HYDROCODONE-ACETAMINOPHEN 10-325 MG PO TABS: 1.0000 | ORAL_TABLET | Freq: Two times a day (BID) | ORAL | 0 refills | Status: DC | PRN

## 2017-04-03 MED ORDER — HYDROCODONE-ACETAMINOPHEN 10-325 MG PO TABS: 1.0000 | ORAL_TABLET | Freq: Three times a day (TID) | ORAL | 0 refills | Status: DC | PRN

## 2017-04-03 NOTE — Patient Instructions (Signed)
In a few days you may receive a survey in the mail or online from [employer] regarding your visit with us today. Please take a moment to fill this out. Your feedback is very important to our whole office. It can help us better understand your needs as well as improve your experience and satisfaction. Thank you for taking your time to complete it. We care about you.  Clennon Nasca, PA-C  

## 2017-04-03 NOTE — Progress Notes (Signed)
BP 116/70   Pulse 85   Temp 98.8 F (37.1 C) (Oral)   Ht 5' 7"  (1.702 m)   Wt 228 lb (103.4 kg)   BMI 35.71 kg/m    Subjective:    Patient ID: Roberto Hanson, male    DOB: 04-Sep-1965, 51 y.o.   MRN: [id number]  HPI: Roberto Hanson is a 51 y.o. male presenting on 04/03/2017 for Neck Pain and Ear Drainage  This patient comes in for periodic recheck on medications and conditions including Chronic pain related to cervical degeneration and low back degeneration. He states he is pretty stable in these areas. At times his cervical muscles will be very tight and decrease his range of motion. He has never had physical therapy for this. He is open to this suggestion. A referral will be placed. All of his medications are stable. He does a very good job taking 30 or less hydrocodone per month. We have updated his prescription will plan to see him back in 3 months..   All medications are reviewed today. There are no reports of any problems with the medications. All of the medical conditions are reviewed and updated.  Lab work is reviewed and will be ordered as medically necessary. There are no new problems reported with today's visit.   Relevant past medical, surgical, family and social history reviewed and updated as indicated. Allergies and medications reviewed and updated.  Past Medical History:  Diagnosis Date  . Anxiety   . Chronic back pain   . OSA (obstructive sleep apnea)     Past Surgical History:  Procedure Laterality Date  . EYE SURGERY      Review of Systems  Constitutional: Negative.  Negative for appetite change and fatigue.  HENT: Negative.   Eyes: Negative.  Negative for pain and visual disturbance.  Respiratory: Negative.  Negative for cough, chest tightness, shortness of breath and wheezing.   Cardiovascular: Negative.  Negative for chest pain, palpitations and leg swelling.  Gastrointestinal: Negative.  Negative for abdominal pain, diarrhea, nausea and  vomiting.  Endocrine: Negative.   Genitourinary: Negative.   Musculoskeletal: Positive for arthralgias, back pain, myalgias, neck pain and neck stiffness.  Skin: Negative.  Negative for color change and rash.  Neurological: Negative.  Negative for weakness, numbness and headaches.  Psychiatric/Behavioral: Negative.     Allergies as of 04/03/2017   No Known Allergies     Medication List       Accurate as of 04/03/17  2:20 PM. Always use your most recent med list.          ALPRAZolam 1 MG tablet Commonly known as:  XANAX Take 1 tablet (1 mg total) by mouth 2 (two) times daily as needed.   HYDROcodone-acetaminophen 10-325 MG tablet Commonly known as:  NORCO Take 1 tablet by mouth every 12 (twelve) hours as needed.   HYDROcodone-acetaminophen 10-325 MG tablet Commonly known as:  NORCO Take 1 tablet by mouth every 8 (eight) hours as needed.   HYDROcodone-acetaminophen 10-325 MG tablet Commonly known as:  NORCO Take 1 tablet by mouth every 8 (eight) hours as needed.          Objective:    BP 116/70   Pulse 85   Temp 98.8 F (37.1 C) (Oral)   Ht 5' 7"  (1.702 m)   Wt 228 lb (103.4 kg)   BMI 35.71 kg/m   No Known Allergies  Physical Exam  Constitutional: He appears well-developed and well-nourished. No distress.  HENT:  Head: Normocephalic and atraumatic.  Eyes: Pupils are equal, round, and reactive to light. Conjunctivae and EOM are normal.  Cardiovascular: Normal rate, regular rhythm and normal heart sounds.   Pulmonary/Chest: Effort normal and breath sounds normal. No respiratory distress.  Musculoskeletal:       Cervical back: He exhibits decreased range of motion, tenderness and spasm.       Lumbar back: He exhibits decreased range of motion, pain and spasm.       Back:  Skin: Skin is warm and dry.  Psychiatric: He has a normal mood and affect. His behavior is normal.  Nursing note and vitals reviewed.   Results for orders placed or performed in visit  on 02/07/17  CMP14+EGFR  Result Value Ref Range   Glucose 101 (H) 65 - 99 mg/dL   BUN 13 6 - 24 mg/dL   Creatinine, Ser 0.93 0.76 - 1.27 mg/dL   GFR calc non Af Amer 95 >59 mL/min/1.73   GFR calc Af Amer 110 >59 mL/min/1.73   BUN/Creatinine Ratio 14 9 - 20   Sodium 140 134 - 144 mmol/L   Potassium 4.1 3.5 - 5.2 mmol/L   Chloride 104 96 - 106 mmol/L   CO2 22 20 - 29 mmol/L   Calcium 8.8 8.7 - 10.2 mg/dL   Total Protein 6.7 6.0 - 8.5 g/dL   Albumin 4.2 3.5 - 5.5 g/dL   Globulin, Total 2.5 1.5 - 4.5 g/dL   Albumin/Globulin Ratio 1.7 1.2 - 2.2   Bilirubin Total 0.9 0.0 - 1.2 mg/dL   Alkaline Phosphatase 53 39 - 117 IU/L   AST 16 0 - 40 IU/L   ALT 31 0 - 44 IU/L  CBC with Differential/Platelet  Result Value Ref Range   WBC 5.8 3.4 - 10.8 x10E3/uL   RBC 4.66 4.14 - 5.80 x10E6/uL   Hemoglobin 14.9 13.0 - 17.7 g/dL   Hematocrit 42.9 37.5 - 51.0 %   MCV 92 79 - 97 fL   MCH 32.0 26.6 - 33.0 pg   MCHC 34.7 31.5 - 35.7 g/dL   RDW 13.0 12.3 - 15.4 %   Platelets 148 (L) 150 - 379 x10E3/uL   Neutrophils 58 Not Estab. %   Lymphs 32 Not Estab. %   Monocytes 9 Not Estab. %   Eos 1 Not Estab. %   Basos 0 Not Estab. %   Neutrophils Absolute 3.3 1.4 - 7.0 x10E3/uL   Lymphocytes Absolute 1.9 0.7 - 3.1 x10E3/uL   Monocytes Absolute 0.5 0.1 - 0.9 x10E3/uL   EOS (ABSOLUTE) 0.1 0.0 - 0.4 x10E3/uL   Basophils Absolute 0.0 0.0 - 0.2 x10E3/uL   Immature Granulocytes 0 Not Estab. %   Immature Grans (Abs) 0.0 0.0 - 0.1 x10E3/uL  TSH  Result Value Ref Range   TSH 1.820 0.450 - 4.500 uIU/mL  Lipid panel  Result Value Ref Range   Cholesterol, Total 149 100 - 199 mg/dL   Triglycerides 118 0 - 149 mg/dL   HDL 37 (L) >39 mg/dL   VLDL Cholesterol Cal 24 5 - 40 mg/dL   LDL Calculated 88 0 - 99 mg/dL   Chol/HDL Ratio 4.0 0.0 - 5.0 ratio  PSA  Result Value Ref Range   Prostate Specific Ag, Serum 1.1 0.0 - 4.0 ng/mL  Testosterone  Result Value Ref Range   Testosterone 689 264 - 916 ng/dL        Assessment & Plan:   1. Chronic cervical pain - Ambulatory referral to Physical  Therapy - HYDROcodone-acetaminophen (NORCO) 10-325 MG tablet; Take 1 tablet by mouth every 12 (twelve) hours as needed.  Dispense: 30 tablet; Refill: 0 - HYDROcodone-acetaminophen (NORCO) 10-325 MG tablet; Take 1 tablet by mouth every 8 (eight) hours as needed.  Dispense: 30 tablet; Refill: 0 - HYDROcodone-acetaminophen (NORCO) 10-325 MG tablet; Take 1 tablet by mouth every 8 (eight) hours as needed.  Dispense: 30 tablet; Refill: 0  2. Chronic bilateral low back pain without sciatica - HYDROcodone-acetaminophen (NORCO) 10-325 MG tablet; Take 1 tablet by mouth every 12 (twelve) hours as needed.  Dispense: 30 tablet; Refill: 0 - HYDROcodone-acetaminophen (NORCO) 10-325 MG tablet; Take 1 tablet by mouth every 8 (eight) hours as needed.  Dispense: 30 tablet; Refill: 0 - HYDROcodone-acetaminophen (NORCO) 10-325 MG tablet; Take 1 tablet by mouth every 8 (eight) hours as needed.  Dispense: 30 tablet; Refill: 0  3. Generalized anxiety disorder    Current Outpatient Prescriptions:  .  ALPRAZolam (XANAX) 1 MG tablet, Take 1 tablet (1 mg total) by mouth 2 (two) times daily as needed., Disp: 60 tablet, Rfl: 5 .  HYDROcodone-acetaminophen (NORCO) 10-325 MG tablet, Take 1 tablet by mouth every 12 (twelve) hours as needed., Disp: 30 tablet, Rfl: 0 .  HYDROcodone-acetaminophen (NORCO) 10-325 MG tablet, Take 1 tablet by mouth every 8 (eight) hours as needed., Disp: 30 tablet, Rfl: 0 .  HYDROcodone-acetaminophen (NORCO) 10-325 MG tablet, Take 1 tablet by mouth every 8 (eight) hours as needed., Disp: 30 tablet, Rfl: 0 Continue all other maintenance medications as listed above.  Follow up plan: Return in about 3 months (around 07/04/2017) for recheck.  Educational handout given for [place] PA-C [place] [address]  [place], [place] [address] [phone]   04/03/2017, 2:20  PM

## 2017-04-05 ENCOUNTER — Ambulatory Visit: Payer: Self-pay | Admitting: Gastroenterology

## 2017-04-11 ENCOUNTER — Encounter: Payer: Self-pay | Admitting: Gastroenterology

## 2017-04-11 ENCOUNTER — Other Ambulatory Visit: Payer: Self-pay

## 2017-04-11 ENCOUNTER — Ambulatory Visit (INDEPENDENT_AMBULATORY_CARE_PROVIDER_SITE_OTHER): Payer: Medicare Other | Admitting: Gastroenterology

## 2017-04-11 ENCOUNTER — Telehealth: Payer: Self-pay

## 2017-04-11 DIAGNOSIS — Z1211 Encounter for screening for malignant neoplasm of colon: Secondary | ICD-10-CM

## 2017-04-11 DIAGNOSIS — F1190 Opioid use, unspecified, uncomplicated: Secondary | ICD-10-CM | POA: Diagnosis not present

## 2017-04-11 MED ORDER — NA SULFATE-K SULFATE-MG SULF 17.5-3.13-1.6 GM/177ML PO SOLN: 1.0000 | ORAL | 0 refills | Status: DC

## 2017-04-11 NOTE — Assessment & Plan Note (Signed)
51 year old gentleman with history of chronic neck/back pain, anxiety that utilizes Xanax each evening and hydrocodone as needed who presents for scheduled first ever colonoscopy.  Plan for deep sedation in the OR.  I have discussed the risks, alternatives, benefits with regards to but not limited to the risk of reaction to medication, bleeding, infection, perforation and the patient is agreeable to proceed. Written consent to be obtained.

## 2017-04-11 NOTE — Patient Instructions (Signed)
1. Colonoscopy scheduled.  Please see separate instructions. 

## 2017-04-11 NOTE — Telephone Encounter (Signed)
Called and informed pt of pre-op appt 05/16/17 at 10:00am. Letter mailed.

## 2017-04-11 NOTE — Progress Notes (Signed)
CC'D TO PCP °

## 2017-04-11 NOTE — Progress Notes (Signed)
Primary Care Physician:  [name], PA-C  Primary Gastroenterologist:  Garfield Cornea, MD   Chief Complaint  Patient presents with  . Colonoscopy    never had tcs    HPI:  Roberto Hanson is a 51 y.o. male here for first ever colonoscopy at the request of Particia Nearing, PA-C.  He was brought into discussed sedation given chronic Xanax and hydrocodone use.  Patient has hereditary eye disorder.  Utilizes special screen apps to magnify writing in order to read.  Takes hydrocortisone several times a week, Xanax each night for chronic neck and back pain.  Denies constipation, diarrhea, melena, rectal bleeding.  No prior colonoscopy.  No family history of colon cancer.  No upper GI symptoms.   Current Outpatient Prescriptions  Medication Sig Dispense Refill  . ALPRAZolam (XANAX) 1 MG tablet Take 1 tablet (1 mg total) by mouth 2 (two) times daily as needed. 60 tablet 5  . HYDROcodone-acetaminophen (NORCO) 10-325 MG tablet Take 1 tablet by mouth every 12 (twelve) hours as needed. 30 tablet 0  . HYDROcodone-acetaminophen (NORCO) 10-325 MG tablet Take 1 tablet by mouth every 8 (eight) hours as needed. 30 tablet 0  . HYDROcodone-acetaminophen (NORCO) 10-325 MG tablet Take 1 tablet by mouth every 8 (eight) hours as needed. 30 tablet 0   No current facility-administered medications for this visit.     Allergies as of 04/11/2017  . (No Known Allergies)    Past Medical History:  Diagnosis Date  . Anxiety   . Chronic back pain   . Chronic neck pain   . Eye disorder   . OSA (obstructive sleep apnea)    cpap    Past Surgical History:  Procedure Laterality Date  . EYE SURGERY      Family History  Problem Relation Age of Onset  . Diabetes Mother   . Diabetes Father   . Diabetes Brother   . Glaucoma Sister   . Healthy Daughter   . Glaucoma Brother   . Diabetes Brother   . Glaucoma Sister   . Colon cancer Neg Hx     Social History   Social History  . Marital status: Legally  Separated    Spouse name: N/A  . Number of children: N/A  . Years of education: N/A   Occupational History  . Not on file.   Social History Main Topics  . Smoking status: Current Every Day Smoker    Packs/day: 0.25    Years: 34.00    Types: Cigarettes    Last attempt to quit: 10/04/2014  . Smokeless tobacco: Never Used  . Alcohol use No     Comment: rare  . Drug use: No  . Sexual activity: Not Currently   Other Topics Concern  . Not on file   Social History Narrative  . No narrative on file      ROS:  General: Negative for anorexia, weight loss, fever, chills, fatigue, weakness. Eyes: poor vision, hereditary. ENT: Negative for hoarseness, difficulty swallowing , nasal congestion. CV: Negative for chest pain, angina, palpitations, dyspnea on exertion, peripheral edema.  Respiratory: Negative for dyspnea at rest, dyspnea on exertion, cough, sputum, wheezing.  GI: See history of present illness. GU:  Negative for dysuria, hematuria, urinary incontinence, urinary frequency, nocturnal urination.  MS: Negative for joint pain, +neck/back pain.  Derm: Negative for rash or itching.  Neuro: Negative for weakness, abnormal sensation, seizure, frequent headaches, memory loss, confusion.  Psych: Negative for anxiety, depression, suicidal ideation, hallucinations.  Endo: Negative for unusual weight change.  Heme: Negative for bruising or bleeding. Allergy: Negative for rash or hives.    Physical Examination:  BP 130/80   Pulse 85   Temp 97.7 F (36.5 C) (Oral)   Ht 5\' 8"  (1.727 m)   Wt 228 lb (103.4 kg)   BMI 34.67 kg/m    General: Well-nourished, well-developed in no acute distress.  Head: Normocephalic, atraumatic.   Eyes: Conjunctiva pink, no icterus. Mouth: Oropharyngeal mucosa moist and pink , no lesions erythema or exudate. Neck: Supple without thyromegaly, masses, or lymphadenopathy.  Lungs: Clear to auscultation bilaterally.  Heart: Regular rate and rhythm, no  murmurs rubs or gallops.  Abdomen: Bowel sounds are normal, nontender, nondistended, no hepatosplenomegaly or masses, no abdominal bruits or    hernia , no rebound or guarding.   Rectal: not performed Extremities: No lower extremity edema. No clubbing or deformities.  Neuro: Alert and oriented x 4 , grossly normal neurologically.  Skin: Warm and dry, no rash or jaundice.   Psych: Alert and cooperative, normal mood and affect.  Labs: Lab Results  Component Value Date   CREATININE 0.93 02/07/2017   BUN 13 02/07/2017   NA 140 02/07/2017   K 4.1 02/07/2017   CL 104 02/07/2017   CO2 22 02/07/2017   Lab Results  Component Value Date   WBC 5.8 02/07/2017   HGB 14.9 02/07/2017   HCT 42.9 02/07/2017   MCV 92 02/07/2017   PLT 148 (L) 02/07/2017   Lab Results  Component Value Date   ALT 31 02/07/2017   AST 16 02/07/2017   ALKPHOS 53 02/07/2017   BILITOT 0.9 02/07/2017     Imaging Studies: No results found.

## 2017-04-15 ENCOUNTER — Ambulatory Visit: Payer: Medicaid Other | Admitting: Physical Therapy

## 2017-04-23 ENCOUNTER — Encounter: Payer: Self-pay | Admitting: Physical Therapy

## 2017-04-23 ENCOUNTER — Ambulatory Visit: Payer: Medicare Other | Attending: Physician Assistant | Admitting: Physical Therapy

## 2017-04-23 DIAGNOSIS — R293 Abnormal posture: Secondary | ICD-10-CM | POA: Diagnosis not present

## 2017-04-23 DIAGNOSIS — M542 Cervicalgia: Secondary | ICD-10-CM | POA: Insufficient documentation

## 2017-04-23 NOTE — Therapy (Signed)
[facility] [place], [place], [address] Phone: [phone]   Fax:  [phone]  Physical Therapy Evaluation  Patient Details  Name: Roberto Hanson MRN: [id number] Date of Birth: 11-Feb-1966 Referring Provider: Particia Nearing PA-C.   Encounter Date: 04/23/2017  PT End of Session - 04/23/17 1354    Visit Number  1    Number of Visits  12    Date for PT Re-Evaluation  06/04/17    PT Start Time  0105    PT Stop Time  0153    PT Time Calculation (min)  48 min    Activity Tolerance  Patient tolerated treatment well    Behavior During Therapy  [facility] for tasks assessed/performed       Past Medical History:  Diagnosis Date  . Anxiety   . Chronic back pain   . Chronic neck pain   . Eye disorder   . OSA (obstructive sleep apnea)    cpap    Past Surgical History:  Procedure Laterality Date  . EYE SURGERY      There were no vitals filed for this visit.   Subjective Assessment - 04/23/17 1329    Subjective  The patient prsents to the clinic today with chronic neck pain that he has had for a t leasr three three.  He states the pain feels like it is "in the neck."  His pain is a 6/10 today but can rise to higher levels with movement.  Heat decreases his pain.    Pertinent History  Chronic low back pain (considering trying an inversion table); vision impairment.    Patient Stated Goals  reduce my neck pain.    Currently in Pain?  Yes    Pain Score  6     Pain Location  Neck    Pain Orientation  Mid    Pain Descriptors / Indicators  Sharp;Aching    Pain Type  Chronic pain    Pain Onset  More than a month ago    Pain Frequency  Constant    Aggravating Factors   See above.    Pain Relieving Factors  See above.         [facility] PT Assessment - 04/23/17 0001      Assessment   Medical Diagnosis  Chronic cervical pain.    Referring Provider  Particia Nearing PA-C.    Onset Date/Surgical Date  -- 3 years+.   3 years+.      Precautions   Precautions  None      Restrictions   Weight Bearing Restrictions  No      Balance Screen   Has the patient fallen in the past 6 months  No      [place] residence      Prior Function   Level of Independence  Independent      Posture/Postural Control   Posture Comments  Significant forward head of rounded shoulder posture.      ROM / Strength   AROM / PROM / Strength  AROM;Strength      AROM   Overall AROM Comments  Bilateral active cervical SBing= 10 degrees; left active cervical rotation= 45 degrees and right= 55 degrees.      Strength   Overall Strength Comments  Normal bilateral UE strength.      Palpation   Palpation comment  Pain reported "in" neck (mid-cervical) with mild cervical paraspinal musculature pain palpable.  Special Tests    Special Tests  -- Diminished bilateral Bicep DTR's; Tri/Brach=2+/4+.   Diminished bilateral Bicep DTR's; Tri/Brach=2+/4+.     Ambulation/Gait   Gait Comments  WNL.             Objective measurements completed on examination: See above findings.      [facility] Adult PT Treatment/Exercise - 04/23/17 0001      Modalities   Modalities  Traction      Traction   Type of Traction  Cervical    Min (lbs)  5    Max (lbs)  17    Hold Time  99    Rest Time  5    Time  15             PT Education - 04/23/17 1339    Education provided  Yes    Education Details  Chin tucks and extension.    Person(s) Educated  Patient;Other (comment)    Methods  Explanation;Demonstration;Verbal cues;Handout    Comprehension  Verbalized understanding;Returned demonstration       PT Short Term Goals - 04/23/17 1359      PT SHORT TERM GOAL #1   Title  STG's=LTG's.        PT Long Term Goals - 04/23/17 1359      PT LONG TERM GOAL #1   Title  Independent with a HEP.    Time  6    Period  Weeks    Status  New      PT LONG TERM GOAL #2   Title  Increase active cervical  rotation to 70 degrees+ so patient can turn head more easily while driving.    Time  6    Period  Weeks    Status  New      PT LONG TERM GOAL #3   Title  Perform ADL's with pain not > 2-3/10.    Time  6    Period  Weeks    Status  New             Plan - 04/23/17 1355    Clinical Impression Statement  The patient prsents to [facility] with chronic neck pain.  He has limitation of range of motion and poor posture.  Bicep reflexes are decreased bilaterally.  Patient expected to improve with skilled physical therapy intervention to address deficts.    History and Personal Factors relevant to plan of care:  Chronic pain.    Clinical Presentation  Evolving    Clinical Decision Making  Low    Rehab Potential  Good    PT Frequency  2x / week    PT Duration  6 weeks    PT Treatment/Interventions  ADLs/Self Care Home Management;Cryotherapy;Electrical Stimulation;Ultrasound;Traction;Moist Heat;Therapeutic activities;Therapeutic exercise;Patient/family education;Manual techniques;Passive range of motion;Dry needling    PT Next Visit Plan  Int traction at 20# with max at 25-26#; STW/M; chin tucks and extension; Combo e'stim/U/S.    Consulted and Agree with Plan of Care  Patient       Patient will benefit from skilled therapeutic intervention in order to improve the following deficits and impairments:  Decreased activity tolerance, Decreased range of motion, Postural dysfunction, Pain  Visit Diagnosis: Cervicalgia - Plan: PT plan of care cert/re-cert  Abnormal posture - Plan: PT plan of care cert/re-cert  G-Codes - 75/10/25 1340    Functional Assessment Tool Used (Outpatient Only)  FOTO...51% limitation.    Functional Limitation  Self care    Self Care  Current Status [phone])  At least 40 percent but less than 60 percent impaired, limited or restricted    Self Care Goal Status (Q0086)  At least 20 percent but less than 40 percent impaired, limited or restricted        Problem  List Patient Active Problem List   Diagnosis Date Noted  . Chronic narcotic use 04/11/2017  . Encounter for screening colonoscopy 04/11/2017  . Chronic cervical pain 04/03/2017  . Generalized anxiety disorder 11/03/2014  . Smoking addiction 11/03/2012  . Decreased vision in both eyes 11/03/2012  . Chronic lumbar pain 11/03/2012    Jarae Nemmers, [place] MPT 04/23/2017, 2:02 PM  [facility] [place], [place], [address] Phone: [phone]   Fax:  [phone]  Name: TIEGAN TERPSTRA MRN: [id number] Date of Birth: 1965-09-24

## 2017-04-25 ENCOUNTER — Ambulatory Visit: Payer: Medicare Other | Admitting: *Deleted

## 2017-04-25 DIAGNOSIS — R293 Abnormal posture: Secondary | ICD-10-CM | POA: Diagnosis not present

## 2017-04-25 DIAGNOSIS — M542 Cervicalgia: Secondary | ICD-10-CM

## 2017-04-25 NOTE — Therapy (Addendum)
[facility] [address] [place], [place], [address] Phone: [phone]   Fax:  [phone]  Physical Therapy Treatment  Patient Details  Name: Roberto Hanson MRN: [id number] Date of Birth: 12/09/1965 Referring Provider: Angel Jones PA-C.   Encounter Date: 04/25/2017  PT End of Session - 04/25/17 1103    Visit Number  2    Number of Visits  12    Date for PT Re-Evaluation  06/04/17    PT Start Time  1030    PT Stop Time  1120    PT Time Calculation (min)  50 min       Past Medical History:  Diagnosis Date  . Anxiety   . Chronic back pain   . Chronic neck pain   . Eye disorder   . OSA (obstructive sleep apnea)    cpap    Past Surgical History:  Procedure Laterality Date  . EYE SURGERY      There were no vitals filed for this visit.  Subjective Assessment - 04/25/17 1030    Subjective  The patient prsents to the clinic today with chronic neck pain that he has had for a t leasr three three.  He states the pain feels like it is "in the neck."  His pain is a 6/10 today but can rise to higher levels with movement.  Heat decreases his pain.    Pertinent History  Chronic low back pain (considering trying an inversion table); vision impairment.    Patient Stated Goals  reduce my neck pain.    Currently in Pain?  Yes    Pain Type  Chronic pain    Pain Onset  More than a month ago    Pain Frequency  Constant                      [facility] Adult PT Treatment/Exercise - 04/25/17 0001      Exercises   Exercises  Neck      Neck Exercises: Standing   Other Standing Exercises  posture stretches with scapular retractions      Neck Exercises: Seated   Neck Retraction  10 reps and with extensions   and with extensions   Cervical Rotation  10 reps      Modalities   Modalities  Traction;Ultrasound      Ultrasound   Ultrasound Location  cervical paras    Ultrasound Parameters   1.5 w/cm2 x 12 mins    Ultrasound Goals  Pain      Traction   Type of Traction  Cervical    Min (lbs)  5    Max (lbs)  17    Hold Time  99    Rest Time  5    Time  15               PT Short Term Goals - 04/23/17 1359      PT SHORT TERM GOAL #1   Title  STG's=LTG's.        PT Long Term Goals - 04/23/17 1359      PT LONG TERM GOAL #1   Title  Independent with a HEP.    Time  6    Period  Weeks    Status  New      PT LONG TERM GOAL #2   Title  Increase active cervical rotation to 70 degrees+ so patient can turn head more easily while driving.    Time    6    Period  Weeks    Status  New      PT LONG TERM GOAL #3   Title  Perform ADL's with pain not > 2-3/10.    Time  6    Period  Weeks    Status  New            Plan - 04/25/17 1104    Clinical Impression Statement  Pt arrived to clinic today doing fairly well after last Rx. HEP and posture were reviewed with some cues for technique. Traction was performed at 20#s and tolerated well and good response to [place]    Clinical Decision Making  Low    Rehab Potential  Good    PT Frequency  2x / week    PT Duration  6 weeks    PT Treatment/Interventions  ADLs/Self Care Home Management;Cryotherapy;Electrical Stimulation;Ultrasound;Traction;Moist Heat;Therapeutic activities;Therapeutic exercise;Patient/family education;Manual techniques;Passive range of motion;Dry needling    PT Next Visit Plan  Int traction at 20# with max at 25-26#; STW/M; chin tucks and extension; Combo e'stim/U/S.    Consulted and Agree with Plan of Care  Patient       Patient will benefit from skilled therapeutic intervention in order to improve the following deficits and impairments:     Visit Diagnosis: Cervicalgia  Abnormal posture     Problem List Patient Active Problem List   Diagnosis Date Noted  . Chronic narcotic use 04/11/2017  . Encounter for screening colonoscopy 04/11/2017  . Chronic cervical pain 04/03/2017  . Generalized anxiety disorder  11/03/2014  . Smoking addiction 11/03/2012  . Decreased vision in both eyes 11/03/2012  . Chronic lumbar pain 11/03/2012    Roberto Hanson,Roberto Hanson, Roberto Hanson 04/25/2017, 11:18 AM  [facility] [place], [place], [address] Phone: [phone]   Fax:  [phone]  Name: Roberto Hanson MRN: [id number] Date of Birth: February 04, 1966  PHYSICAL THERAPY DISCHARGE SUMMARY  Visits from Start of Care: 2.  Current functional level related to goals / functional outcomes: See above.   Remaining deficits: See below.   Education / Equipment:  Plan: Patient agrees to discharge.  Patient goals were not met. Patient is being discharged due to not returning since the last visit.  ?????         Mali Applegate MPT

## 2017-04-30 ENCOUNTER — Encounter: Payer: Self-pay | Admitting: *Deleted

## 2017-05-02 ENCOUNTER — Encounter: Payer: Self-pay | Admitting: *Deleted

## 2017-05-14 NOTE — Patient Instructions (Signed)
Roberto Hanson  05/14/2017     @PREFPERIOPPHARMACY @   Your procedure is scheduled on  05/22/2017   Report to [facility] at  1015  A.M.  Call this number if you have problems the morning of surgery:  [phone]   Remember:  Do not eat food or drink liquids after midnight.  Take these medicines the morning of surgery with A SIP OF WATER  Xanax, hydrocodone.   Do not wear jewelry, make-up or nail polish.  Do not wear lotions, powders, or perfumes, or deoderant.  Do not shave 48 hours prior to surgery.  Men may shave face and neck.  Do not bring valuables to the hospital.  [facility] is not responsible for any belongings or valuables.  Contacts, dentures or bridgework may not be worn into surgery.  Leave your suitcase in the car.  After surgery it may be brought to your room.  For patients admitted to the hospital, discharge time will be determined by your treatment team.  Patients discharged the day of surgery will not be allowed to drive home.   Name and phone number of your driver:   family Special instructions:  Follow the diet and prep instructions given to you by Dr Roseanne Kaufman office.  Please read over the following fact sheets that you were given. Anesthesia Post-op Instructions and Care and Recovery After Surgery       Colonoscopy, Adult A colonoscopy is an exam to look at the entire large intestine. During the exam, a lubricated, bendable tube is inserted into the anus and then passed into the rectum, colon, and other parts of the large intestine. A colonoscopy is often done as a part of normal colorectal screening or in response to certain symptoms, such as anemia, persistent diarrhea, abdominal pain, and blood in the stool. The exam can help screen for and diagnose medical problems, including:  Tumors.  Polyps.  Inflammation.  Areas of bleeding.  Tell a health care provider about:  Any allergies you have.  All medicines you are  taking, including vitamins, herbs, eye drops, creams, and over-the-counter medicines.  Any problems you or family members have had with anesthetic medicines.  Any blood disorders you have.  Any surgeries you have had.  Any medical conditions you have.  Any problems you have had passing stool. What are the risks? Generally, this is a safe procedure. However, problems may occur, including:  Bleeding.  A tear in the intestine.  A reaction to medicines given during the exam.  Infection (rare).  What happens before the procedure? Eating and drinking restrictions Follow instructions from your health care provider about eating and drinking, which may include:  A few days before the procedure - follow a low-fiber diet. Avoid nuts, seeds, dried fruit, raw fruits, and vegetables.  1-3 days before the procedure - follow a clear liquid diet. Drink only clear liquids, such as clear broth or bouillon, black coffee or tea, clear juice, clear soft drinks or sports drinks, gelatin dessert, and popsicles. Avoid any liquids that contain red or purple dye.  On the day of the procedure - do not eat or drink anything during the 2 hours before the procedure, or within the time period that your health care provider recommends.  Bowel prep If you were prescribed an oral bowel prep to clean out your colon:  Take it as told by your health care provider. Starting the day before your procedure, you  will need to drink a large amount of medicated liquid. The liquid will cause you to have multiple loose stools until your stool is almost clear or light green.  If your skin or anus gets irritated from diarrhea, you may use these to relieve the irritation: ? Medicated wipes, such as adult wet wipes with aloe and vitamin E. ? A skin soothing-product like petroleum jelly.  If you vomit while drinking the bowel prep, take a break for up to 60 minutes and then begin the bowel prep again. If vomiting continues and  you cannot take the bowel prep without vomiting, call your health care provider.  General instructions  Ask your health care provider about changing or stopping your regular medicines. This is especially important if you are taking diabetes medicines or blood thinners.  Plan to have someone take you home from the hospital or clinic. What happens during the procedure?  An IV tube may be inserted into one of your veins.  You will be given medicine to help you relax (sedative).  To reduce your risk of infection: ? Your health care team will wash or sanitize their hands. ? Your anal area will be washed with soap.  You will be asked to lie on your side with your knees bent.  Your health care provider will lubricate a long, thin, flexible tube. The tube will have a camera and a light on the end.  The tube will be inserted into your anus.  The tube will be gently eased through your rectum and colon.  Air will be delivered into your colon to keep it open. You may feel some pressure or cramping.  The camera will be used to take images during the procedure.  A small tissue sample may be removed from your body to be examined under a microscope (biopsy). If any potential problems are found, the tissue will be sent to a lab for testing.  If small polyps are found, your health care provider may remove them and have them checked for cancer cells.  The tube that was inserted into your anus will be slowly removed. The procedure may vary among health care providers and hospitals. What happens after the procedure?  Your blood pressure, heart rate, breathing rate, and blood oxygen level will be monitored until the medicines you were given have worn off.  Do not drive for 24 hours after the exam.  You may have a small amount of blood in your stool.  You may pass gas and have mild abdominal cramping or bloating due to the air that was used to inflate your colon during the exam.  It is up to  you to get the results of your procedure. Ask your health care provider, or the department performing the procedure, when your results will be ready. This information is not intended to replace advice given to you by your health care provider. Make sure you discuss any questions you have with your health care provider. Document Released: 06/01/2000 Document Revised: 04/04/2016 Document Reviewed: 08/16/2015 Elsevier Interactive Patient Education  2018 [employer].  Colonoscopy, Adult, Care After This sheet gives you information about how to care for yourself after your procedure. Your health care provider may also give you more specific instructions. If you have problems or questions, contact your health care provider. What can I expect after the procedure? After the procedure, it is common to have:  A small amount of blood in your stool for 24 hours after the procedure.  Some  gas.  Mild abdominal cramping or bloating.  Follow these instructions at home: General instructions   For the first 24 hours after the procedure: ? Do not drive or use machinery. ? Do not sign important documents. ? Do not drink alcohol. ? Do your regular daily activities at a slower pace than normal. ? Eat soft, easy-to-digest foods. ? Rest often.  Take over-the-counter or prescription medicines only as told by your health care provider.  It is up to you to get the results of your procedure. Ask your health care provider, or the department performing the procedure, when your results will be ready. Relieving cramping and bloating  Try walking around when you have cramps or feel bloated.  Apply heat to your abdomen as told by your health care provider. Use a heat source that your health care provider recommends, such as a moist heat pack or a heating pad. ? Place a towel between your skin and the heat source. ? Leave the heat on for 20-30 minutes. ? Remove the heat if your skin turns bright red. This is  especially important if you are unable to feel pain, heat, or cold. You may have a greater risk of getting burned. Eating and drinking  Drink enough fluid to keep your urine clear or pale yellow.  Resume your normal diet as instructed by your health care provider. Avoid heavy or fried foods that are hard to digest.  Avoid drinking alcohol for as long as instructed by your health care provider. Contact a health care provider if:  You have blood in your stool 2-3 days after the procedure. Get help right away if:  You have more than a small spotting of blood in your stool.  You pass large blood clots in your stool.  Your abdomen is swollen.  You have nausea or vomiting.  You have a fever.  You have increasing abdominal pain that is not relieved with medicine. This information is not intended to replace advice given to you by your health care provider. Make sure you discuss any questions you have with your health care provider. Document Released: 01/17/2004 Document Revised: 02/27/2016 Document Reviewed: 08/16/2015 Elsevier Interactive Patient Education  2018 [place] Anesthesia is a term that refers to techniques, procedures, and medicines that help a person stay safe and comfortable during a medical procedure. Monitored anesthesia care, or sedation, is one type of anesthesia. Your anesthesia specialist may recommend sedation if you will be having a procedure that does not require you to be unconscious, such as:  Cataract surgery.  A dental procedure.  A biopsy.  A colonoscopy.  During the procedure, you may receive a medicine to help you relax (sedative). There are three levels of sedation:  Mild sedation. At this level, you may feel awake and relaxed. You will be able to follow directions.  Moderate sedation. At this level, you will be sleepy. You may not remember the procedure.  Deep sedation. At this level, you will be asleep. You will  not remember the procedure.  The more medicine you are given, the deeper your level of sedation will be. Depending on how you respond to the procedure, the anesthesia specialist may change your level of sedation or the type of anesthesia to fit your needs. An anesthesia specialist will monitor you closely during the procedure. Let your health care provider know about:  Any allergies you have.  All medicines you are taking, including vitamins, herbs, eye drops, creams, and over-the-counter  medicines.  Any use of steroids (by mouth or as a cream).  Any problems you or family members have had with sedatives and anesthetic medicines.  Any blood disorders you have.  Any surgeries you have had.  Any medical conditions you have, such as sleep apnea.  Whether you are pregnant or may be pregnant.  Any use of cigarettes, alcohol, or street drugs. What are the risks? Generally, this is a safe procedure. However, problems may occur, including:  Getting too much medicine (oversedation).  Nausea.  Allergic reaction to medicines.  Trouble breathing. If this happens, a breathing tube may be used to help with breathing. It will be removed when you are awake and breathing on your own.  Heart trouble.  Lung trouble.  Before the procedure Staying hydrated Follow instructions from your health care provider about hydration, which may include:  Up to 2 hours before the procedure - you may continue to drink clear liquids, such as water, clear fruit juice, black coffee, and plain tea.  Eating and drinking restrictions Follow instructions from your health care provider about eating and drinking, which may include:  8 hours before the procedure - stop eating heavy meals or foods such as meat, fried foods, or fatty foods.  6 hours before the procedure - stop eating light meals or foods, such as toast or cereal.  6 hours before the procedure - stop drinking milk or drinks that contain milk.  2  hours before the procedure - stop drinking clear liquids.  Medicines Ask your health care provider about:  Changing or stopping your regular medicines. This is especially important if you are taking diabetes medicines or blood thinners.  Taking medicines such as aspirin and ibuprofen. These medicines can thin your blood. Do not take these medicines before your procedure if your health care provider instructs you not to.  Tests and exams  You will have a physical exam.  You may have blood tests done to show: ? How well your kidneys and liver are working. ? How well your blood can clot.  General instructions  Plan to have someone take you home from the hospital or clinic.  If you will be going home right after the procedure, plan to have someone with you for 24 hours.  What happens during the procedure?  Your blood pressure, heart rate, breathing, level of pain and overall condition will be monitored.  An IV tube will be inserted into one of your veins.  Your anesthesia specialist will give you medicines as needed to keep you comfortable during the procedure. This may mean changing the level of sedation.  The procedure will be performed. After the procedure  Your blood pressure, heart rate, breathing rate, and blood oxygen level will be monitored until the medicines you were given have worn off.  Do not drive for 24 hours if you received a sedative.  You may: ? Feel sleepy, clumsy, or nauseous. ? Feel forgetful about what happened after the procedure. ? Have a sore throat if you had a breathing tube during the procedure. ? Vomit. This information is not intended to replace advice given to you by your health care provider. Make sure you discuss any questions you have with your health care provider. Document Released: 02/28/2005 Document Revised: 11/11/2015 Document Reviewed: 09/25/2015 Elsevier Interactive Patient Education  2018 [place],  Care After These instructions provide you with information about caring for yourself after your procedure. Your health care provider may also give  you more specific instructions. Your treatment has been planned according to current medical practices, but problems sometimes occur. Call your health care provider if you have any problems or questions after your procedure. What can I expect after the procedure? After your procedure, it is common to:  Feel sleepy for several hours.  Feel clumsy and have poor balance for several hours.  Feel forgetful about what happened after the procedure.  Have poor judgment for several hours.  Feel nauseous or vomit.  Have a sore throat if you had a breathing tube during the procedure.  Follow these instructions at home: For at least 24 hours after the procedure:   Do not: ? Participate in activities in which you could fall or become injured. ? Drive. ? Use heavy machinery. ? Drink alcohol. ? Take sleeping pills or medicines that cause drowsiness. ? Make important decisions or sign legal documents. ? Take care of children on your own.  Rest. Eating and drinking  Follow the diet that is recommended by your health care provider.  If you vomit, drink water, juice, or soup when you can drink without vomiting.  Make sure you have little or no nausea before eating solid foods. General instructions  Have a responsible adult stay with you until you are awake and alert.  Take over-the-counter and prescription medicines only as told by your health care provider.  If you smoke, do not smoke without supervision.  Keep all follow-up visits as told by your health care provider. This is important. Contact a health care provider if:  You keep feeling nauseous or you keep vomiting.  You feel light-headed.  You develop a rash.  You have a fever. Get help right away if:  You have trouble breathing. This information is not intended to replace  advice given to you by your health care provider. Make sure you discuss any questions you have with your health care provider. Document Released: 09/25/2015 Document Revised: 01/25/2016 Document Reviewed: 09/25/2015 Elsevier Interactive Patient Education  [employer].

## 2017-05-16 ENCOUNTER — Encounter (HOSPITAL_COMMUNITY)
Admission: RE | Admit: 2017-05-16 | Discharge: 2017-05-16 | Disposition: A | Discharge status: Home / Self Care | Payer: Medicare Other | Source: Ambulatory Visit | Attending: Internal Medicine | Admitting: Internal Medicine

## 2017-05-16 ENCOUNTER — Other Ambulatory Visit: Payer: Self-pay

## 2017-05-16 ENCOUNTER — Encounter (HOSPITAL_COMMUNITY): Payer: Self-pay

## 2017-05-16 DIAGNOSIS — Z01810 Encounter for preprocedural cardiovascular examination: Secondary | ICD-10-CM | POA: Diagnosis present

## 2017-05-16 DIAGNOSIS — F1190 Opioid use, unspecified, uncomplicated: Secondary | ICD-10-CM | POA: Insufficient documentation

## 2017-05-16 DIAGNOSIS — H543 Unqualified visual loss, both eyes: Secondary | ICD-10-CM | POA: Diagnosis not present

## 2017-05-16 DIAGNOSIS — G8929 Other chronic pain: Secondary | ICD-10-CM | POA: Insufficient documentation

## 2017-05-16 DIAGNOSIS — Z01812 Encounter for preprocedural laboratory examination: Secondary | ICD-10-CM | POA: Insufficient documentation

## 2017-05-16 DIAGNOSIS — M542 Cervicalgia: Secondary | ICD-10-CM | POA: Diagnosis not present

## 2017-05-16 DIAGNOSIS — F17200 Nicotine dependence, unspecified, uncomplicated: Secondary | ICD-10-CM | POA: Diagnosis not present

## 2017-05-16 LAB — BASIC METABOLIC PANEL
Anion gap: 7 (ref 5–15)
BUN: 12 mg/dL (ref 6–20)
CO2: 28 mmol/L (ref 22–32)
CREATININE: 0.91 mg/dL (ref 0.61–1.24)
Calcium: 9.2 mg/dL (ref 8.9–10.3)
Chloride: 103 mmol/L (ref 101–111)
GFR calc non Af Amer: >60 mL/min (ref >60)
Glucose, Bld: 94 mg/dL (ref 65–99)
Potassium: 4 mmol/L (ref 3.5–5.1)
SODIUM: 138 mmol/L (ref 135–145)

## 2017-05-16 LAB — CBC
HCT: 45.6 % (ref 39.0–52.0)
Hemoglobin: 15 g/dL (ref 13.0–17.0)
MCH: 31.3 pg (ref 26.0–34.0)
MCHC: 32.9 g/dL (ref 30.0–36.0)
MCV: 95 fL (ref 78.0–100.0)
PLATELETS: 154 10*3/uL (ref 150–400)
RBC: 4.8 MIL/uL (ref 4.22–5.81)
RDW: 13 % (ref 11.5–15.5)
WBC: 6.2 10*3/uL (ref 4.0–10.5)

## 2017-05-22 ENCOUNTER — Ambulatory Visit (HOSPITAL_COMMUNITY): Payer: Medicare Other | Admitting: Anesthesiology

## 2017-05-22 ENCOUNTER — Encounter (HOSPITAL_COMMUNITY)
Admission: RE | Disposition: A | Discharge status: Home / Self Care | Payer: Self-pay | Source: Ambulatory Visit | Attending: Internal Medicine

## 2017-05-22 ENCOUNTER — Ambulatory Visit (HOSPITAL_COMMUNITY)
Admission: RE | Admit: 2017-05-22 | Discharge: 2017-05-22 | Disposition: A | Discharge status: Home / Self Care | Payer: Medicare Other | Source: Ambulatory Visit | Attending: Internal Medicine | Admitting: Internal Medicine

## 2017-05-22 ENCOUNTER — Encounter (HOSPITAL_COMMUNITY): Payer: Self-pay | Admitting: *Deleted

## 2017-05-22 DIAGNOSIS — Z87891 Personal history of nicotine dependence: Secondary | ICD-10-CM | POA: Insufficient documentation

## 2017-05-22 DIAGNOSIS — D125 Benign neoplasm of sigmoid colon: Secondary | ICD-10-CM | POA: Diagnosis not present

## 2017-05-22 DIAGNOSIS — G4733 Obstructive sleep apnea (adult) (pediatric): Secondary | ICD-10-CM | POA: Insufficient documentation

## 2017-05-22 DIAGNOSIS — D122 Benign neoplasm of ascending colon: Secondary | ICD-10-CM | POA: Diagnosis not present

## 2017-05-22 DIAGNOSIS — Z1211 Encounter for screening for malignant neoplasm of colon: Secondary | ICD-10-CM | POA: Insufficient documentation

## 2017-05-22 DIAGNOSIS — D128 Benign neoplasm of rectum: Secondary | ICD-10-CM | POA: Diagnosis not present

## 2017-05-22 DIAGNOSIS — D123 Benign neoplasm of transverse colon: Secondary | ICD-10-CM | POA: Insufficient documentation

## 2017-05-22 DIAGNOSIS — Z9989 Dependence on other enabling machines and devices: Secondary | ICD-10-CM | POA: Insufficient documentation

## 2017-05-22 DIAGNOSIS — F419 Anxiety disorder, unspecified: Secondary | ICD-10-CM | POA: Insufficient documentation

## 2017-05-22 DIAGNOSIS — K621 Rectal polyp: Secondary | ICD-10-CM | POA: Diagnosis not present

## 2017-05-22 DIAGNOSIS — K635 Polyp of colon: Secondary | ICD-10-CM | POA: Diagnosis not present

## 2017-05-22 HISTORY — PX: COLONOSCOPY WITH PROPOFOL: SHX5780

## 2017-05-22 HISTORY — PX: POLYPECTOMY: SHX5525

## 2017-05-22 SURGERY — COLONOSCOPY WITH PROPOFOL
Anesthesia: Monitor Anesthesia Care

## 2017-05-22 MED ORDER — LACTATED RINGERS IV SOLN
INTRAVENOUS | Status: DC
  Administered 2017-05-22: 12:00:00 via INTRAVENOUS

## 2017-05-22 MED ORDER — ONDANSETRON 4 MG PO TBDP
ORAL_TABLET | ORAL | Status: AC
  Filled 2017-05-22: qty 1

## 2017-05-22 MED ORDER — PROPOFOL 500 MG/50ML IV EMUL
INTRAVENOUS | Status: DC | PRN
  Administered 2017-05-22 (×2): via INTRAVENOUS
  Administered 2017-05-22: 150 ug/kg/min via INTRAVENOUS

## 2017-05-22 MED ORDER — MIDAZOLAM HCL 2 MG/2ML IJ SOLN
1.0000 mg | Freq: Once | INTRAMUSCULAR | Status: AC | PRN
  Administered 2017-05-22: 2 mg via INTRAVENOUS
  Filled 2017-05-22: qty 2

## 2017-05-22 MED ORDER — ONDANSETRON 4 MG PO TBDP
4.0000 mg | ORAL_TABLET | Freq: Once | ORAL | Status: AC
  Administered 2017-05-22: 4 mg via ORAL

## 2017-05-22 MED ORDER — PROPOFOL 10 MG/ML IV BOLUS
INTRAVENOUS | Status: AC
  Filled 2017-05-22: qty 40

## 2017-05-22 NOTE — Anesthesia Procedure Notes (Signed)
Procedure Name: MAC Date/Time: 05/22/2017 12:44 PM Performed by: Andree Elk Jeiry Birnbaum A, CRNA Pre-anesthesia Checklist: Patient identified, Emergency Drugs available, Suction available, Patient being monitored and Timeout performed Oxygen Delivery Method: Simple face mask

## 2017-05-22 NOTE — Anesthesia Preprocedure Evaluation (Addendum)
Anesthesia Evaluation  Patient identified by MRN, date of birth, ID band Patient awake  General Assessment Comment:Seeing impaired  Airway Mallampati: II  TM Distance: >3 FB Neck ROM: Full   Comment: Thick neck Dental no notable dental hx. (+) Teeth Intact   Pulmonary sleep apnea , Current Smoker, former smoker,    Pulmonary exam normal        Cardiovascular Exercise Tolerance: Good negative cardio ROS Normal cardiovascular exam Rhythm:Regular Rate:Normal  Normal sinus rhythm Normal ECG  (Nov 2018)   Neuro/Psych    GI/Hepatic negative GI ROS, Neg liver ROS,   Endo/Other  negative endocrine ROS  Renal/GU negative Renal ROS     Musculoskeletal   Abdominal Normal abdominal exam  (+)   Peds  Hematology negative hematology ROS (+)   Anesthesia Other Findings   Reproductive/Obstetrics                            Anesthesia Physical Anesthesia Plan  ASA: II  Anesthesia Plan: MAC   Post-op Pain Management:    Induction:   PONV Risk Score and Plan:   Airway Management Planned: Nasal Cannula  Additional Equipment:   Intra-op Plan:   Post-operative Plan:   Informed Consent: I have reviewed the patients History and Physical, chart, labs and discussed the procedure including the risks, benefits and alternatives for the proposed anesthesia with the patient or authorized representative who has indicated his/her understanding and acceptance.   Dental advisory given  Plan Discussed with: CRNA  Anesthesia Plan Comments:         Anesthesia Quick Evaluation

## 2017-05-22 NOTE — Op Note (Signed)
[facility] Patient Name: Roberto Hanson Procedure Date: 05/22/2017 12:41 PM MRN: [id number] Date of Birth: 1966-03-20 Attending MD: Norvel Richards , MD CSN: [id number] Age: 51 Admit Type: Outpatient Procedure:                Colonoscopy Indications:              Screening for colorectal malignant neoplasm Providers:                Norvel Richards, MD, Otis Peak B. Sharon Seller, RN,                            Nelma Rothman, Technician, Aram Candela Referring MD:              Medicines:                Propofol per Anesthesia Complications:            No immediate complications. Estimated Blood Loss:     Estimated blood loss was minimal. Procedure:                Pre-Anesthesia Assessment:                           - Prior to the procedure, a History and Physical                            was performed, and patient medications and                            allergies were reviewed. The patient's tolerance of                            previous anesthesia was also reviewed. The risks                            and benefits of the procedure and the sedation                            options and risks were discussed with the patient.                            All questions were answered, and informed consent                            was obtained. Prior Anticoagulants: The patient has                            taken no previous anticoagulant or antiplatelet                            agents. ASA Grade Assessment: II - A patient with                            mild systemic disease. After reviewing the risks  and benefits, the patient was deemed in                            satisfactory condition to undergo the procedure.                           After obtaining informed consent, the colonoscope                            was passed under direct vision. Throughout the                            procedure, the patient's blood pressure, pulse, and                           oxygen saturations were monitored continuously. The                            EC-[id number] ([id number]) scope was introduced through                            the and advanced to the the cecum, identified by                            appendiceal orifice and ileocecal valve. The                            colonoscopy was performed without difficulty. The                            patient tolerated the procedure well. The entire                            colon was well visualized. The ileocecal valve,                            appendiceal orifice, and rectum were photographed.                            The quality of the bowel preparation was adequate. Scope In: 12:58:07 PM Scope Out: 1:20:49 PM Scope Withdrawal Time: 0 hours 18 minutes 36 seconds  Total Procedure Duration: 0 hours 22 minutes 42 seconds  Findings:      The perianal and digital rectal examinations were normal.      Five semi-pedunculated polyps were found in the rectum, sigmoid colon       and ascending colon. The polyps were 4 to 6 mm in size. These polyps       were removed with a cold snare. Resection and retrieval were complete.       Estimated blood loss was minimal.      A 8 mm polyp was found in the transverse colon distal transverse colon.       The polyp was semi-pedunculated. The polyp was removed with a hot snare.       Resection and retrieval were complete. Estimated blood loss: none.  A 6 mm polyp was found in the rectum. The polyp was sessile. The polyp       was removed with a hot snare. Resection and retrieval were complete.       Estimated blood loss: none.      The exam was otherwise without abnormality on direct and retroflexion       views. Impression:               - Five 4 to 6 mm polyps in the rectum, in the                            sigmoid colon and in the ascending colon, removed                            with a cold snare. Resected and retrieved.                            - One 8 mm polyp in the transverse colon in the                            distal transverse colon, removed with a hot snare.                            Resected and retrieved.                           - One 6 mm polyp in the rectum, removed with a hot                            snare. Resected and retrieved.                           - The examination was otherwise normal on direct                            and retroflexion views. Moderate Sedation:      Moderate (conscious) sedation was personally administered by an       anesthesia professional. The following parameters were monitored: oxygen       saturation, heart rate, blood pressure, respiratory rate, EKG, adequacy       of pulmonary ventilation, and response to care. Total physician       intraservice time was 32 minutes. Recommendation:           - Patient has a contact number available for                            emergencies. The signs and symptoms of potential                            delayed complications were discussed with the                            patient. Return to normal activities tomorrow.  Written discharge instructions were provided to the                            patient.                           - Resume previous diet.                           - Continue present medications.                           - Await pathology results.                           - Repeat colonoscopy for surveillance based on                            pathology results.                           - Return to GI clinic (date not yet determined). Procedure Code(s):        --- Professional ---                           [phone], Colonoscopy, flexible; with removal of                            tumor(s), polyp(s), or other lesion(s) by snare                            technique Diagnosis Code(s):        --- Professional ---                           Z12.11, Encounter for screening for malignant                             neoplasm of colon                           K62.1, Rectal polyp                           D12.5, Benign neoplasm of sigmoid colon                           D12.2, Benign neoplasm of ascending colon                           D12.3, Benign neoplasm of transverse colon (hepatic                            flexure or splenic flexure) CPT copyright 2016 American Medical Association. All rights reserved. The codes documented in this report are preliminary and upon coder review may  be revised to meet current compliance requirements. Cristopher Estimable. Gala Romney, MD Norvel Richards, MD 05/22/2017 1:31:48  PM This report has been signed electronically. Number of Addenda: 0

## 2017-05-22 NOTE — H&P (Signed)
_0 @   Primary Care Physician:  [name], PA-C Primary Gastroenterologist:  Dr. Gala Romney  Pre-Procedure History & Physical: HPI:  Roberto Hanson is a 51 y.o. male is here for a screening colonoscopy.   No bowel symptoms. No family history of colon cancer. No prior colonoscopy.  Past Medical History:  Diagnosis Date  . Anxiety   . Chronic back pain   . Chronic neck pain   . Eye disorder   . OSA (obstructive sleep apnea)    cpap    Past Surgical History:  Procedure Laterality Date  . EYE SURGERY      Prior to Admission medications   Medication Sig Start Date End Date Taking? Authorizing Provider  ALPRAZolam Duanne Moron) 1 MG tablet Take 1 tablet (1 mg total) by mouth 2 (two) times daily as needed. Patient taking differently: Take 1 mg by mouth 2 (two) times daily as needed for anxiety.  01/30/17  Yes [name], PA-C  Artificial Tear Ointment (DRY EYES OP) Place 2 drops into both eyes as needed (for dry eyes).   Yes [provider]  HYDROcodone-acetaminophen (NORCO) 10-325 MG tablet Take 1 tablet by mouth every 8 (eight) hours as needed. Patient taking differently: Take 1 tablet by mouth every 8 (eight) hours as needed for moderate pain.  04/03/17  Yes Particia Nearing S, PA-C  Na Sulfate-K Sulfate-Mg Sulf (SUPREP BOWEL PREP KIT) 17.5-3.13-1.6 GM/177ML SOLN Take 1 kit by mouth as directed. 04/11/17  Yes Renee Beale, Cristopher Estimable, MD  naproxen sodium (ALEVE) 220 MG tablet Take 220 mg by mouth 2 (two) times daily as needed (for pain or headache).   Yes [provider]    Allergies as of 04/11/2017  . (No Known Allergies)    Family History  Problem Relation Age of Onset  . Diabetes Mother   . Diabetes Father   . Diabetes Brother   . Glaucoma Sister   . Healthy Daughter   . Glaucoma Brother   . Diabetes Brother   . Glaucoma Sister   . Colon cancer Neg Hx     Social History   Socioeconomic History  . Marital status: Legally Separated    Spouse name: Not on  file  . Number of children: Not on file  . Years of education: Not on file  . Highest education level: Not on file  Social Needs  . Financial resource strain: Not on file  . Food insecurity - worry: Not on file  . Food insecurity - inability: Not on file  . Transportation needs - medical: Not on file  . Transportation needs - non-medical: Not on file  Occupational History  . Not on file  Tobacco Use  . Smoking status: Former Smoker    Packs/day: 0.25    Years: 34.00    Pack years: 8.50    Types: Cigarettes    Last attempt to quit: 04/19/2015    Years since quitting: 2.0  . Smokeless tobacco: Never Used  Substance and Sexual Activity  . Alcohol use: No    Comment: rare  . Drug use: No  . Sexual activity: Not Currently  Other Topics Concern  . Not on file  Social History Narrative  . Not on file    Review of Systems: See HPI, otherwise negative ROS  Physical Exam: BP 115/73   Pulse 62   Temp 98.1 F (36.7 C) (Oral)   Resp 17   Ht 5' 8" (1.727 m)   Wt 226 lb (102.5  kg)   SpO2 100%   BMI 34.36 kg/m  General:   Alert,   pleasant and cooperative in NAD Head:  Normocephalic and atraumatic. Eyes:  Disconjugate gaze.   Conjunctiva pink. Lungs:  Clear throughout to auscultation.   No wheezes, crackles, or rhonchi. No acute distress. Heart:  Regular rate and rhythm; no murmurs, clicks, rubs,  or gallops. Abdomen:  Soft, nontender and nondistended. No masses, hepatosplenomegaly or hernias noted. Normal bowel sounds, without guarding, and without rebound.   Impression/Plan: Roberto Hanson is now here to undergo a screening colonoscopy. First-ever average risk screening examination.  Risks, benefits, limitations, imponderables and alternatives regarding colonoscopy have been reviewed with the patient. Questions have been answered. All parties agreeable.           Notice:  This dictation was prepared with Dragon dictation along with smaller phrase technology. Any  transcriptional errors that result from this process are unintentional and may not be corrected upon review.

## 2017-05-22 NOTE — Transfer of Care (Signed)
Immediate Anesthesia Transfer of Care Note  Patient: Roberto Hanson  Procedure(s) Performed: COLONOSCOPY WITH PROPOFOL (N/A ) POLYPECTOMY  Patient Location: PACU  Anesthesia Type:MAC  Level of Consciousness: awake, alert , oriented and patient cooperative  Airway & Oxygen Therapy: Patient Spontanous Breathing  Post-op Assessment: Report given to RN and Post -op Vital signs reviewed and stable  Post vital signs: Reviewed and stable  Last Vitals:  Vitals:   05/22/17 1235 05/22/17 1240  BP: 109/73 115/73  Pulse:    Resp: 13 17  Temp:    SpO2: 99% 100%    Last Pain:  Vitals:   05/22/17 1118  TempSrc: Oral         Complications: No apparent anesthesia complications

## 2017-05-22 NOTE — Anesthesia Postprocedure Evaluation (Signed)
Anesthesia Post Note  Patient: ADVIK WEATHERSPOON  Procedure(s) Performed: COLONOSCOPY WITH PROPOFOL (N/A ) POLYPECTOMY  Patient location during evaluation: PACU Anesthesia Type: MAC Level of consciousness: awake and alert and oriented Pain management: pain level controlled Vital Signs Assessment: post-procedure vital signs reviewed and stable Respiratory status: spontaneous breathing Cardiovascular status: stable Postop Assessment: no apparent nausea or vomiting Anesthetic complications: no     Last Vitals:  Vitals:   05/22/17 1235 05/22/17 1240  BP: 109/73 115/73  Pulse:    Resp: 13 17  Temp:    SpO2: 99% 100%    Last Pain:  Vitals:   05/22/17 1118  TempSrc: Oral                 ADAMS, AMY A

## 2017-05-22 NOTE — Discharge Instructions (Signed)
Colonoscopy Discharge Instructions  Read the instructions outlined below and refer to this sheet in the next few weeks. These discharge instructions provide you with general information on caring for yourself after you leave the hospital. Your doctor may also give you specific instructions. While your treatment has been planned according to the most current medical practices available, unavoidable complications occasionally occur. If you have any problems or questions after discharge, call Dr. Gala Romney at [phone]. ACTIVITY  You may resume your regular activity, but move at a slower pace for the next 24 hours.   Take frequent rest periods for the next 24 hours.   Walking will help get rid of the air and reduce the bloated feeling in your belly (abdomen).   No driving for 24 hours (because of the medicine (anesthesia) used during the test).    Do not sign any important legal documents or operate any machinery for 24 hours (because of the anesthesia used during the test).  NUTRITION  Drink plenty of fluids.   You may resume your normal diet as instructed by your doctor.   Begin with a light meal and progress to your normal diet. Heavy or fried foods are harder to digest and may make you feel sick to your stomach (nauseated).   Avoid alcoholic beverages for 24 hours or as instructed.  MEDICATIONS  You may resume your normal medications unless your doctor tells you otherwise.  WHAT YOU CAN EXPECT TODAY  Some feelings of bloating in the abdomen.   Passage of more gas than usual.   Spotting of blood in your stool or on the toilet paper.  IF YOU HAD POLYPS REMOVED DURING THE COLONOSCOPY:  No aspirin products for 7 days or as instructed.   No alcohol for 7 days or as instructed.   Eat a soft diet for the next 24 hours.  FINDING OUT THE RESULTS OF YOUR TEST Not all test results are available during your visit. If your test results are not back during the visit, make an appointment  with your caregiver to find out the results. Do not assume everything is normal if you have not heard from your caregiver or the medical facility. It is important for you to follow up on all of your test results.  SEEK IMMEDIATE MEDICAL ATTENTION IF:  You have more than a spotting of blood in your stool.   Your belly is swollen (abdominal distention).   You are nauseated or vomiting.   You have a temperature over 101.   You have abdominal pain or discomfort that is severe or gets worse throughout the day.    Colon polyp information provided  Further recommendations to follow pending review of pathology report.     Colon Polyps Polyps are tissue growths inside the body. Polyps can grow in many places, including the large intestine (colon). A polyp may be a round bump or a mushroom-shaped growth. You could have one polyp or several. Most colon polyps are noncancerous (benign). However, some colon polyps can become cancerous over time. What are the causes? The exact cause of colon polyps is not known. What increases the risk? This condition is more likely to develop in people who:  Have a family history of colon cancer or colon polyps.  Are older than 49 or older than 45 if they are African American.  Have inflammatory bowel disease, such as ulcerative colitis or Crohn disease.  Are overweight.  Smoke cigarettes.  Do not get enough exercise.  Drink too  much alcohol.  Eat a diet that is: ? High in fat and red meat. ? Low in fiber.  Had childhood cancer that was treated with abdominal radiation.  What are the signs or symptoms? Most polyps do not cause symptoms. If you have symptoms, they may include:  Blood coming from your rectum when having a bowel movement.  Blood in your stool.The stool may look dark red or black.  A change in bowel habits, such as constipation or diarrhea.  How is this diagnosed? This condition is diagnosed with a colonoscopy. This is a  procedure that uses a lighted, flexible scope to look at the inside of your colon. How is this treated? Treatment for this condition involves removing any polyps that are found. Those polyps will then be tested for cancer. If cancer is found, your health care provider will talk to you about options for colon cancer treatment. Follow these instructions at home: Diet  Eat plenty of fiber, such as fruits, vegetables, and whole grains.  Eat foods that are high in calcium and vitamin D, such as milk, cheese, yogurt, eggs, liver, fish, and broccoli.  Limit foods high in fat, red meats, and processed meats, such as hot dogs, sausage, bacon, and lunch meats.  Maintain a healthy weight, or lose weight if recommended by your health care provider. General instructions  Do not smoke cigarettes.  Do not drink alcohol excessively.  Keep all follow-up visits as told by your health care provider. This is important. This includes keeping regularly scheduled colonoscopies. Talk to your health care provider about when you need a colonoscopy.  Exercise every day or as told by your health care provider. Contact a health care provider if:  You have new or worsening bleeding during a bowel movement.  You have new or increased blood in your stool.  You have a change in bowel habits.  You unexpectedly lose weight. This information is not intended to replace advice given to you by your health care provider. Make sure you discuss any questions you have with your health care provider. Document Released: 02/29/2004 Document Revised: 11/10/2015 Document Reviewed: 04/25/2015 Elsevier Interactive Patient Education  2018 [employer] POST-ANESTHESIA  IMMEDIATELY FOLLOWING SURGERY:  Do not drive or operate machinery for the first twenty four hours after surgery.  Do not make any important decisions for twenty four hours after surgery or while taking narcotic pain medications or  sedatives.  If you develop intractable nausea and vomiting or a severe headache please notify your doctor immediately.  FOLLOW-UP:  Please make an appointment with your surgeon as instructed. You do not need to follow up with anesthesia unless specifically instructed to do so.  WOUND CARE INSTRUCTIONS (if applicable):  Keep a dry clean dressing on the anesthesia/puncture wound site if there is drainage.  Once the wound has quit draining you may leave it open to air.  Generally you should leave the bandage intact for twenty four hours unless there is drainage.  If the epidural site drains for more than 36-48 hours please call the anesthesia department.  QUESTIONS?:  Please feel free to call your physician or the hospital operator if you have any questions, and they will be happy to assist you.

## 2017-05-24 ENCOUNTER — Encounter: Payer: Self-pay | Admitting: Internal Medicine

## 2017-05-28 ENCOUNTER — Encounter (HOSPITAL_COMMUNITY): Payer: Self-pay | Admitting: Internal Medicine

## 2017-07-05 ENCOUNTER — Ambulatory Visit (INDEPENDENT_AMBULATORY_CARE_PROVIDER_SITE_OTHER): Payer: Medicare Other | Admitting: Physician Assistant

## 2017-07-05 ENCOUNTER — Encounter: Payer: Self-pay | Admitting: Physician Assistant

## 2017-07-05 VITALS — BP 110/72 | HR 73 | Temp 98.0°F | Ht 68.0 in | Wt 223.2 lb

## 2017-07-05 DIAGNOSIS — H543 Unqualified visual loss, both eyes: Secondary | ICD-10-CM

## 2017-07-05 DIAGNOSIS — M542 Cervicalgia: Secondary | ICD-10-CM | POA: Diagnosis not present

## 2017-07-05 DIAGNOSIS — G8929 Other chronic pain: Secondary | ICD-10-CM | POA: Diagnosis not present

## 2017-07-05 DIAGNOSIS — M545 Low back pain, unspecified: Secondary | ICD-10-CM

## 2017-07-05 DIAGNOSIS — F411 Generalized anxiety disorder: Secondary | ICD-10-CM

## 2017-07-05 MED ORDER — HYDROCODONE-ACETAMINOPHEN 10-325 MG PO TABS: 1.0000 | ORAL_TABLET | Freq: Three times a day (TID) | ORAL | 0 refills | Status: DC | PRN

## 2017-07-05 MED ORDER — ALPRAZOLAM 1 MG PO TABS: 1.0000 mg | ORAL_TABLET | Freq: Two times a day (BID) | ORAL | 5 refills | Status: DC | PRN

## 2017-07-05 NOTE — Patient Instructions (Signed)
In a few days you may receive a survey in the mail or online from [employer] regarding your visit with us today. Please take a moment to fill this out. Your feedback is very important to our whole office. It can help us better understand your needs as well as improve your experience and satisfaction. Thank you for taking your time to complete it. We care about you.  Miakoda Mcmillion, PA-C  

## 2017-07-05 NOTE — Progress Notes (Signed)
BP 110/72   Pulse 73   Temp 98 F (36.7 C) (Oral)   Ht 5\' 8"  (1.727 m)   Wt 223 lb 3.2 oz (101.2 kg)   BMI 33.94 kg/m    Subjective:    Patient ID: Roberto Hanson, male    DOB: 12-17-1965, 52 y.o.   MRN: [id number]  HPI: Roberto Hanson is a 52 y.o. male presenting on 07/05/2017 for Follow-up (3 month )  This patient comes in for periodic recheck on medications and conditions including chronic cervical pain, chronic lumbar pain, generalized anxiety.  His medications are reviewed and refilled.  He states overall he is doing fairly well.  He states he does need a referral to an ophthalmologist.  He does have low vision.  It is been a bit of time since he has been seen by an ophthalmologist..   All medications are reviewed today. There are no reports of any problems with the medications. All of the medical conditions are reviewed and updated.  Lab work is reviewed and will be ordered as medically necessary. There are no new problems reported with today's visit.    Relevant past medical, surgical, family and social history reviewed and updated as indicated. Allergies and medications reviewed and updated.  Past Medical History:  Diagnosis Date  . Anxiety   . Chronic back pain   . Chronic neck pain   . Eye disorder   . OSA (obstructive sleep apnea)    cpap    Past Surgical History:  Procedure Laterality Date  . COLONOSCOPY WITH PROPOFOL N/A 05/22/2017   Procedure: COLONOSCOPY WITH PROPOFOL;  Surgeon: [name], MD;  Location: [facility];  Service: Endoscopy;  Laterality: N/A;  12:00pm  . EYE SURGERY    . POLYPECTOMY  05/22/2017   Procedure: POLYPECTOMY;  Surgeon: [name], MD;  Location: [facility];  Service: Endoscopy;;  colon    Review of Systems  Constitutional: Negative.  Negative for appetite change and fatigue.  HENT: Negative.   Eyes: Negative.  Negative for pain and visual disturbance.  Respiratory: Negative.  Negative for cough, chest  tightness, shortness of breath and wheezing.   Cardiovascular: Negative.  Negative for chest pain, palpitations and leg swelling.  Gastrointestinal: Negative.  Negative for abdominal pain, diarrhea, nausea and vomiting.  Endocrine: Negative.   Genitourinary: Negative.   Musculoskeletal: Negative.   Skin: Negative.  Negative for color change and rash.  Neurological: Negative.  Negative for weakness, numbness and headaches.  Psychiatric/Behavioral: Negative.     Allergies as of 07/05/2017   No Known Allergies     Medication List        Accurate as of 07/05/17  2:24 PM. Always use your most recent med list.          ALPRAZolam 1 MG tablet Commonly known as:  XANAX Take 1 tablet (1 mg total) by mouth 2 (two) times daily as needed.   DRY EYES OP Place 2 drops into both eyes as needed (for dry eyes).   HYDROcodone-acetaminophen 10-325 MG tablet Commonly known as:  NORCO Take 1 tablet by mouth every 8 (eight) hours as needed.   naproxen sodium 220 MG tablet Commonly known as:  ALEVE Take 220 mg by mouth 2 (two) times daily as needed (for pain or headache).          Objective:    BP 110/72   Pulse 73   Temp 98 F (36.7 C) (Oral)   Ht  5\' 8"  (1.727 m)   Wt 223 lb 3.2 oz (101.2 kg)   BMI 33.94 kg/m   No Known Allergies  Physical Exam  Constitutional: He appears well-developed and well-nourished. No distress.  HENT:  Head: Normocephalic and atraumatic.  Eyes: Conjunctivae and EOM are normal. Pupils are equal, round, and reactive to light.  Cardiovascular: Normal rate, regular rhythm and normal heart sounds.  Pulmonary/Chest: Effort normal and breath sounds normal. No respiratory distress.  Skin: Skin is warm and dry.  Psychiatric: He has a normal mood and affect. His behavior is normal.  Nursing note and vitals reviewed.   Results for orders placed or performed during the hospital encounter of 05/16/17  CBC  Result Value Ref Range   WBC 6.2 4.0 - 10.5 K/uL    RBC 4.80 4.22 - 5.81 MIL/uL   Hemoglobin 15.0 13.0 - 17.0 g/dL   HCT 45.6 39.0 - 52.0 %   MCV 95.0 78.0 - 100.0 fL   MCH 31.3 26.0 - 34.0 pg   MCHC 32.9 30.0 - 36.0 g/dL   RDW 13.0 11.5 - 15.5 %   Platelets 154 150 - 400 K/uL  Basic metabolic panel  Result Value Ref Range   Sodium 138 135 - 145 mmol/L   Potassium 4.0 3.5 - 5.1 mmol/L   Chloride 103 101 - 111 mmol/L   CO2 28 22 - 32 mmol/L   Glucose, Bld 94 65 - 99 mg/dL   BUN 12 6 - 20 mg/dL   Creatinine, Ser 0.91 0.61 - 1.24 mg/dL   Calcium 9.2 8.9 - 10.3 mg/dL   GFR calc non Af Amer >60 >60 mL/min   GFR calc Af Amer >60 >60 mL/min   Anion gap 7 5 - 15      Assessment & Plan:   1. Chronic cervical pain - HYDROcodone-acetaminophen (NORCO) 10-325 MG tablet; Take 1 tablet by mouth every 8 (eight) hours as needed.  Dispense: 30 tablet; Refill: 0  2. Chronic bilateral low back pain without sciatica - HYDROcodone-acetaminophen (NORCO) 10-325 MG tablet; Take 1 tablet by mouth every 8 (eight) hours as needed.  Dispense: 30 tablet; Refill: 0  3. GAD (generalized anxiety disorder) - ALPRAZolam (XANAX) 1 MG tablet; Take 1 tablet (1 mg total) by mouth 2 (two) times daily as needed.  Dispense: 60 tablet; Refill: 5     Current Outpatient Medications:  .  ALPRAZolam (XANAX) 1 MG tablet, Take 1 tablet (1 mg total) by mouth 2 (two) times daily as needed. (Patient taking differently: Take 1 mg by mouth 2 (two) times daily as needed for anxiety. ), Disp: 60 tablet, Rfl: 5 .  Artificial Tear Ointment (DRY EYES OP), Place 2 drops into both eyes as needed (for dry eyes)., Disp: , Rfl:  .  HYDROcodone-acetaminophen (NORCO) 10-325 MG tablet, Take 1 tablet by mouth every 8 (eight) hours as needed. (Patient taking differently: Take 1 tablet by mouth every 8 (eight) hours as needed for moderate pain. ), Disp: 30 tablet, Rfl: 0 .  naproxen sodium (ALEVE) 220 MG tablet, Take 220 mg by mouth 2 (two) times daily as needed (for pain or headache)., Disp: ,  Rfl:  Continue all other maintenance medications as listed above.  Follow up plan: No Follow-up on file.  Educational handout given for [place] PA-C [place] [address]  [place], [place] [address] [phone]   07/05/2017, 2:24 PM

## 2017-07-09 ENCOUNTER — Other Ambulatory Visit: Payer: Self-pay | Admitting: Physician Assistant

## 2017-07-09 DIAGNOSIS — H543 Unqualified visual loss, both eyes: Secondary | ICD-10-CM

## 2017-07-17 DIAGNOSIS — H4720 Unspecified optic atrophy: Secondary | ICD-10-CM | POA: Diagnosis not present

## 2017-07-17 DIAGNOSIS — H2703 Aphakia, bilateral: Secondary | ICD-10-CM | POA: Diagnosis not present

## 2017-07-31 ENCOUNTER — Telehealth: Payer: Self-pay | Admitting: Physician Assistant

## 2017-07-31 NOTE — Telephone Encounter (Signed)
Pt put in a police report today about his stolen rx for hydrocodone. He wanted to let Particia Nearing know.

## 2017-08-01 ENCOUNTER — Telehealth: Payer: Self-pay | Admitting: Physician Assistant

## 2017-08-01 NOTE — Telephone Encounter (Signed)
FYI only.

## 2017-08-01 NOTE — Telephone Encounter (Signed)
noted 

## 2017-08-01 NOTE — Telephone Encounter (Signed)
Pt aware that cannot have refill until due date. He is ok with that

## 2017-08-15 ENCOUNTER — Ambulatory Visit (INDEPENDENT_AMBULATORY_CARE_PROVIDER_SITE_OTHER): Payer: Medicare Other | Admitting: Physician Assistant

## 2017-08-15 ENCOUNTER — Encounter: Payer: Self-pay | Admitting: Physician Assistant

## 2017-08-15 DIAGNOSIS — M545 Low back pain, unspecified: Secondary | ICD-10-CM

## 2017-08-15 DIAGNOSIS — M542 Cervicalgia: Secondary | ICD-10-CM

## 2017-08-15 DIAGNOSIS — G8929 Other chronic pain: Secondary | ICD-10-CM

## 2017-08-15 MED ORDER — HYDROCODONE-ACETAMINOPHEN 10-325 MG PO TABS: 1.0000 | ORAL_TABLET | Freq: Three times a day (TID) | ORAL | 0 refills | Status: DC | PRN

## 2017-08-15 MED ORDER — HYDROCODONE-ACETAMINOPHEN 10-325 MG PO TABS: 1.0000 | ORAL_TABLET | Freq: Four times a day (QID) | ORAL | 0 refills | Status: DC | PRN

## 2017-08-15 MED ORDER — NAPROXEN 500 MG PO TABS: 500.0000 mg | ORAL_TABLET | Freq: Two times a day (BID) | ORAL | 3 refills | Status: DC

## 2017-08-15 NOTE — Progress Notes (Signed)
BP 91/60   Pulse 83   Temp 98.5 F (36.9 C) (Oral)   Ht 5\' 8"  (1.727 m)   Wt 227 lb 12.8 oz (103.3 kg)   BMI 34.64 kg/m    Subjective:    Patient ID: Roberto Hanson, male    DOB: 1966-02-02, 52 y.o.   MRN: [id number]  HPI: Roberto Hanson is a 52 y.o. male presenting on 08/15/2017 for Follow-up (2 month )  This patient comes in for periodic recheck on medications and conditions including pain and anxiety. He needs naproxen sent, had just been taking over the counter.  Doing well.  All medications are reviewed today. There are no reports of any problems with the medications. All of the medical conditions are reviewed and updated.  Lab work is reviewed and will be ordered as medically necessary. There are no new problems reported with today's visit.   Past Medical History:  Diagnosis Date  . Anxiety   . Chronic back pain   . Chronic neck pain   . Eye disorder   . OSA (obstructive sleep apnea)    cpap   Relevant past medical, surgical, family and social history reviewed and updated as indicated. Interim medical history since our last visit reviewed. Allergies and medications reviewed and updated. DATA REVIEWED: CHART IN EPIC  Family History reviewed for pertinent findings.  Review of Systems  Constitutional: Negative.  Negative for appetite change and fatigue.  HENT: Negative.   Eyes: Negative.  Negative for pain and visual disturbance.  Respiratory: Negative.  Negative for cough, chest tightness, shortness of breath and wheezing.   Cardiovascular: Negative.  Negative for chest pain, palpitations and leg swelling.  Gastrointestinal: Negative.  Negative for abdominal pain, diarrhea, nausea and vomiting.  Endocrine: Negative.   Genitourinary: Negative.   Musculoskeletal: Negative.   Skin: Negative.  Negative for color change and rash.  Neurological: Negative.  Negative for weakness, numbness and headaches.  Psychiatric/Behavioral: Negative.     Allergies as of  08/15/2017   No Known Allergies     Medication List        Accurate as of 08/15/17  3:55 PM. Always use your most recent med list.          ALPRAZolam 1 MG tablet Commonly known as:  XANAX Take 1 tablet (1 mg total) by mouth 2 (two) times daily as needed.   DRY EYES OP Place 2 drops into both eyes as needed (for dry eyes).   HYDROcodone-acetaminophen 10-325 MG tablet Commonly known as:  NORCO Take 1 tablet by mouth every 8 (eight) hours as needed.   HYDROcodone-acetaminophen 10-325 MG tablet Commonly known as:  NORCO Take 1 tablet by mouth every 6 (six) hours as needed.   HYDROcodone-acetaminophen 10-325 MG tablet Commonly known as:  NORCO Take 1 tablet by mouth every 6 (six) hours as needed.   naproxen 500 MG tablet Commonly known as:  NAPROSYN Take 1 tablet (500 mg total) by mouth 2 (two) times daily with a meal.          Objective:    BP 91/60   Pulse 83   Temp 98.5 F (36.9 C) (Oral)   Ht 5\' 8"  (1.727 m)   Wt 227 lb 12.8 oz (103.3 kg)   BMI 34.64 kg/m   No Known Allergies  Wt Readings from Last 3 Encounters:  08/15/17 227 lb 12.8 oz (103.3 kg)  07/05/17 223 lb 3.2 oz (101.2 kg)  05/22/17 226 lb (102.5 kg)  Physical Exam  Constitutional: He appears well-developed and well-nourished. No distress.  HENT:  Head: Normocephalic and atraumatic.  Eyes: Conjunctivae and EOM are normal. Pupils are equal, round, and reactive to light.  Cardiovascular: Normal rate, regular rhythm and normal heart sounds.  Pulmonary/Chest: Effort normal and breath sounds normal. No respiratory distress.  Skin: Skin is warm and dry.  Psychiatric: He has a normal mood and affect. His behavior is normal.  Nursing note and vitals reviewed.       Assessment & Plan:   1. Chronic cervical pain - HYDROcodone-acetaminophen (NORCO) 10-325 MG tablet; Take 1 tablet by mouth every 8 (eight) hours as needed.  Dispense: 30 tablet; Refill: 0  2. Chronic bilateral low back pain  without sciatica - HYDROcodone-acetaminophen (NORCO) 10-325 MG tablet; Take 1 tablet by mouth every 8 (eight) hours as needed.  Dispense: 30 tablet; Refill: 0   Continue all other maintenance medications as listed above.  Follow up plan: Return in about 3 months (around 11/12/2017) for recheck.  Educational handout given for [place] PA-C [facility] [address]  [place], [place] [address] [phone]   08/15/2017, 3:55 PM

## 2017-08-15 NOTE — Patient Instructions (Signed)
In a few days you may receive a survey in the mail or online from [employer] regarding your visit with us today. Please take a moment to fill this out. Your feedback is very important to our whole office. It can help us better understand your needs as well as improve your experience and satisfaction. Thank you for taking your time to complete it. We care about you.  Kenney Going, PA-C  

## 2017-10-16 DIAGNOSIS — H5501 Congenital nystagmus: Secondary | ICD-10-CM | POA: Diagnosis not present

## 2017-10-16 DIAGNOSIS — H2703 Aphakia, bilateral: Secondary | ICD-10-CM | POA: Diagnosis not present

## 2017-10-16 DIAGNOSIS — H538 Other visual disturbances: Secondary | ICD-10-CM | POA: Diagnosis not present

## 2017-10-16 DIAGNOSIS — Q120 Congenital cataract: Secondary | ICD-10-CM | POA: Diagnosis not present

## 2017-12-10 ENCOUNTER — Ambulatory Visit: Payer: Medicare Other | Admitting: Physician Assistant

## 2018-01-04 ENCOUNTER — Other Ambulatory Visit: Payer: Self-pay | Admitting: Physician Assistant

## 2018-01-04 DIAGNOSIS — F411 Generalized anxiety disorder: Secondary | ICD-10-CM

## 2018-01-09 ENCOUNTER — Telehealth: Payer: Self-pay | Admitting: Physician Assistant

## 2018-01-10 ENCOUNTER — Other Ambulatory Visit: Payer: Self-pay

## 2018-01-10 DIAGNOSIS — F411 Generalized anxiety disorder: Secondary | ICD-10-CM

## 2018-01-10 NOTE — Telephone Encounter (Signed)
Last seen 08/15/17  [facility]

## 2018-01-13 MED ORDER — ALPRAZOLAM 1 MG PO TABS: 1.0000 mg | ORAL_TABLET | Freq: Two times a day (BID) | ORAL | 0 refills | Status: DC | PRN

## 2018-02-13 ENCOUNTER — Ambulatory Visit (INDEPENDENT_AMBULATORY_CARE_PROVIDER_SITE_OTHER): Payer: Medicare Other | Admitting: Physician Assistant

## 2018-02-13 ENCOUNTER — Encounter: Payer: Self-pay | Admitting: Physician Assistant

## 2018-02-13 VITALS — BP 109/74 | HR 60 | Ht 68.0 in | Wt 227.8 lb

## 2018-02-13 DIAGNOSIS — F411 Generalized anxiety disorder: Secondary | ICD-10-CM | POA: Diagnosis not present

## 2018-02-13 DIAGNOSIS — D649 Anemia, unspecified: Secondary | ICD-10-CM

## 2018-02-13 DIAGNOSIS — M545 Low back pain: Secondary | ICD-10-CM | POA: Diagnosis not present

## 2018-02-13 DIAGNOSIS — M542 Cervicalgia: Secondary | ICD-10-CM | POA: Diagnosis not present

## 2018-02-13 DIAGNOSIS — G8929 Other chronic pain: Secondary | ICD-10-CM | POA: Diagnosis not present

## 2018-02-13 DIAGNOSIS — Z0000 Encounter for general adult medical examination without abnormal findings: Secondary | ICD-10-CM

## 2018-02-13 MED ORDER — HYDROCODONE-ACETAMINOPHEN 10-325 MG PO TABS: 1.0000 | ORAL_TABLET | Freq: Four times a day (QID) | ORAL | 0 refills | Status: DC | PRN

## 2018-02-13 MED ORDER — ALPRAZOLAM 1 MG PO TABS: 1.0000 mg | ORAL_TABLET | Freq: Two times a day (BID) | ORAL | 5 refills | Status: DC | PRN

## 2018-02-13 MED ORDER — HYDROCODONE-ACETAMINOPHEN 10-325 MG PO TABS: 1.0000 | ORAL_TABLET | Freq: Three times a day (TID) | ORAL | 0 refills | Status: DC | PRN

## 2018-02-17 NOTE — Progress Notes (Signed)
BP 109/74   Pulse 60   Ht 5' 8"  (1.727 m)   Wt 227 lb 12.8 oz (103.3 kg)   BMI 34.64 kg/m    Subjective:    Patient ID: Roberto Hanson, male    DOB: 05/01/1966, 52 y.o.   MRN: [id number]  HPI: Roberto Hanson is a 52 y.o. male presenting on 02/13/2018 for Pain (6 month )  This patient comes in for a standard 63-monthcheckup on his medical conditions.  He does take a infrequent but regular amount of hydrocodone.  Most the time his prescriptions will last 6 months.  Same with his alprazolam.  He reports he is not having any difficulties at this time.  All of his chart and refills are reviewed.  A new pain contract and narcotic contract are updated  Past Medical History:  Diagnosis Date  . Anxiety   . Chronic back pain   . Chronic neck pain   . Eye disorder   . OSA (obstructive sleep apnea)    cpap   Relevant past medical, surgical, family and social history reviewed and updated as indicated. Interim medical history since our last visit reviewed. Allergies and medications reviewed and updated. DATA REVIEWED: CHART IN EPIC  Family History reviewed for pertinent findings.  Review of Systems  Constitutional: Negative.  Negative for appetite change and fatigue.  Eyes: Negative for pain and visual disturbance.  Respiratory: Negative.  Negative for cough, chest tightness, shortness of breath and wheezing.   Cardiovascular: Negative.  Negative for chest pain, palpitations and leg swelling.  Gastrointestinal: Negative.  Negative for abdominal pain, diarrhea, nausea and vomiting.  Genitourinary: Negative.   Skin: Negative.  Negative for color change and rash.  Neurological: Negative.  Negative for weakness, numbness and headaches.  Psychiatric/Behavioral: Negative.     Allergies as of 02/13/2018   No Known Allergies     Medication List        Accurate as of 02/13/18 11:59 PM. Always use your most recent med list.          ALPRAZolam 1 MG tablet Commonly known  as:  XANAX Take 1 tablet (1 mg total) by mouth 2 (two) times daily as needed.   DRY EYES OP Place 2 drops into both eyes as needed (for dry eyes).   HYDROcodone-acetaminophen 10-325 MG tablet Commonly known as:  NORCO Take 1 tablet by mouth every 8 (eight) hours as needed.   HYDROcodone-acetaminophen 10-325 MG tablet Commonly known as:  NORCO Take 1 tablet by mouth every 6 (six) hours as needed.   HYDROcodone-acetaminophen 10-325 MG tablet Commonly known as:  NORCO Take 1 tablet by mouth every 6 (six) hours as needed.   naproxen 500 MG tablet Commonly known as:  NAPROSYN Take 1 tablet (500 mg total) by mouth 2 (two) times daily with a meal.          Objective:    BP 109/74   Pulse 60   Ht 5' 8"  (1.727 m)   Wt 227 lb 12.8 oz (103.3 kg)   BMI 34.64 kg/m   No Known Allergies  Wt Readings from Last 3 Encounters:  02/13/18 227 lb 12.8 oz (103.3 kg)  08/15/17 227 lb 12.8 oz (103.3 kg)  07/05/17 223 lb 3.2 oz (101.2 kg)    Physical Exam  Constitutional: He appears well-developed and well-nourished. No distress.  HENT:  Head: Normocephalic and atraumatic.  Eyes: Pupils are equal, round, and reactive to light. Conjunctivae and EOM  are normal.  Cardiovascular: Normal rate, regular rhythm and normal heart sounds.  Pulmonary/Chest: Effort normal and breath sounds normal. No respiratory distress.  Skin: Skin is warm and dry.  Psychiatric: He has a normal mood and affect. His behavior is normal.  Nursing note and vitals reviewed.       Assessment & Plan:   1. GAD (generalized anxiety disorder) - ALPRAZolam (XANAX) 1 MG tablet; Take 1 tablet (1 mg total) by mouth 2 (two) times daily as needed.  Dispense: 60 tablet; Refill: 5  2. Chronic cervical pain - HYDROcodone-acetaminophen (NORCO) 10-325 MG tablet; Take 1 tablet by mouth every 8 (eight) hours as needed.  Dispense: 30 tablet; Refill: 0  3. Chronic bilateral low back pain without sciatica -  HYDROcodone-acetaminophen (NORCO) 10-325 MG tablet; Take 1 tablet by mouth every 8 (eight) hours as needed.  Dispense: 30 tablet; Refill: 0  4. Well adult exam - CBC with Differential/Platelet - Lipid panel - CMP14+EGFR - TSH - PSA  5. Anemia, unspecified type - CBC with Differential/Platelet   Continue all other maintenance medications as listed above.  Follow up plan: Return in about 6 months (around 08/16/2018) for recheck.  Educational handout given for [place] PA-C [facility] [address]  [place], [place] [address] [phone]   02/17/2018, 9:40 PM

## 2018-02-22 ENCOUNTER — Other Ambulatory Visit: Payer: Medicare Other

## 2018-02-22 DIAGNOSIS — Z136 Encounter for screening for cardiovascular disorders: Secondary | ICD-10-CM | POA: Diagnosis not present

## 2018-02-22 DIAGNOSIS — Z125 Encounter for screening for malignant neoplasm of prostate: Secondary | ICD-10-CM | POA: Diagnosis not present

## 2018-02-23 LAB — CBC WITH DIFFERENTIAL/PLATELET
BASOS: 0 %
Basophils Absolute: 0 10*3/uL (ref 0.0–0.2)
EOS (ABSOLUTE): 0.2 10*3/uL (ref 0.0–0.4)
Eos: 2 %
Hematocrit: 46.1 % (ref 37.5–51.0)
Hemoglobin: 15.8 g/dL (ref 13.0–17.7)
IMMATURE GRANS (ABS): 0 10*3/uL (ref 0.0–0.1)
Immature Granulocytes: 0 %
LYMPHS ABS: 1.9 10*3/uL (ref 0.7–3.1)
LYMPHS: 31 %
MCH: 31.9 pg (ref 26.6–33.0)
MCHC: 34.3 g/dL (ref 31.5–35.7)
MCV: 93 fL (ref 79–97)
MONOS ABS: 0.6 10*3/uL (ref 0.1–0.9)
Monocytes: 9 %
NEUTROS ABS: 3.5 10*3/uL (ref 1.4–7.0)
Neutrophils: 58 %
PLATELETS: 162 10*3/uL (ref 150–450)
RBC: 4.95 x10E6/uL (ref 4.14–5.80)
RDW: 13.6 % (ref 12.3–15.4)
WBC: 6.2 10*3/uL (ref 3.4–10.8)

## 2018-02-23 LAB — CMP14+EGFR
A/G RATIO: 1.8 (ref 1.2–2.2)
ALK PHOS: 61 IU/L (ref 39–117)
ALT: 21 IU/L (ref 0–44)
AST: 13 IU/L (ref 0–40)
Albumin: 4.5 g/dL (ref 3.5–5.5)
BILIRUBIN TOTAL: 1.7 mg/dL — AB (ref 0.0–1.2)
BUN/Creatinine Ratio: 19 (ref 9–20)
BUN: 17 mg/dL (ref 6–24)
CALCIUM: 8.7 mg/dL (ref 8.7–10.2)
CHLORIDE: 103 mmol/L (ref 96–106)
CO2: 24 mmol/L (ref 20–29)
CREATININE: 0.88 mg/dL (ref 0.76–1.27)
GFR calc Af Amer: 115 mL/min/{1.73_m2} (ref >59)
GFR calc non Af Amer: 99 mL/min/{1.73_m2} (ref >59)
GLUCOSE: 86 mg/dL (ref 65–99)
Globulin, Total: 2.5 g/dL (ref 1.5–4.5)
POTASSIUM: 4.3 mmol/L (ref 3.5–5.2)
SODIUM: 140 mmol/L (ref 134–144)
Total Protein: 7 g/dL (ref 6.0–8.5)

## 2018-02-23 LAB — LIPID PANEL
Chol/HDL Ratio: 3.5 ratio (ref 0.0–5.0)
Cholesterol, Total: 142 mg/dL (ref 100–199)
HDL: 41 mg/dL (ref >39)
LDL Calculated: 83 mg/dL (ref 0–99)
Triglycerides: 88 mg/dL (ref 0–149)
VLDL CHOLESTEROL CAL: 18 mg/dL (ref 5–40)

## 2018-02-23 LAB — PSA: PROSTATE SPECIFIC AG, SERUM: 0.9 ng/mL (ref 0.0–4.0)

## 2018-02-23 LAB — TSH: TSH: 2.04 u[IU]/mL (ref 0.450–4.500)

## 2018-02-25 ENCOUNTER — Telehealth: Payer: Self-pay | Admitting: Physician Assistant

## 2018-02-25 NOTE — Telephone Encounter (Signed)
Patient aware of results.

## 2018-04-24 ENCOUNTER — Telehealth: Payer: Self-pay | Admitting: Physician Assistant

## 2018-04-24 NOTE — Telephone Encounter (Signed)
appt made for pt on 05/09/18

## 2018-05-09 ENCOUNTER — Ambulatory Visit (INDEPENDENT_AMBULATORY_CARE_PROVIDER_SITE_OTHER): Payer: Medicare Other | Admitting: Physician Assistant

## 2018-05-09 ENCOUNTER — Encounter: Payer: Self-pay | Admitting: Physician Assistant

## 2018-05-09 DIAGNOSIS — M545 Low back pain: Secondary | ICD-10-CM

## 2018-05-09 DIAGNOSIS — M542 Cervicalgia: Secondary | ICD-10-CM

## 2018-05-09 DIAGNOSIS — G8929 Other chronic pain: Secondary | ICD-10-CM

## 2018-05-09 MED ORDER — HYDROCODONE-ACETAMINOPHEN 10-325 MG PO TABS: 1.0000 | ORAL_TABLET | Freq: Four times a day (QID) | ORAL | 0 refills | Status: DC | PRN

## 2018-05-09 MED ORDER — FLUOXETINE HCL 40 MG PO CAPS: 40.0000 mg | ORAL_CAPSULE | Freq: Every day | ORAL | 3 refills | Status: DC

## 2018-05-09 MED ORDER — HYDROCODONE-ACETAMINOPHEN 10-325 MG PO TABS: 1.0000 | ORAL_TABLET | Freq: Three times a day (TID) | ORAL | 0 refills | Status: DC | PRN

## 2018-05-10 NOTE — Progress Notes (Signed)
BP 107/67   Pulse 68   Temp (!) 97.1 F (36.2 C) (Oral)   Ht 5' 8"  (1.727 m)   Wt 223 lb 6.4 oz (101.3 kg)   BMI 33.97 kg/m    Subjective:    Patient ID: Roberto Hanson, Roberto Hanson    DOB: 1966-01-28, 52 y.o.   MRN: [id number]  HPI: Roberto Hanson is a 52 y.o. Roberto Hanson presenting on 05/09/2018 for Discuss CPAP  HE comes in for a face to face encounter for his severe sleep apnea and CPAP prescription.  He was diagnosed as far back as 2008 in his sleep study at [facility] under Dr. Brandon Melnick.  He needs another machine and updated supplies.  He had been on 13 cm of water as his pressure.  We will send a new script and place him on an autotitration with the range from 7-20 cm.   We have also looked at his pain medication which he takes very infrequently. He has signed a Psychologist, forensic.  No red flags in the registry.   Past Medical History:  Diagnosis Date  . Anxiety   . Chronic back pain   . Chronic neck pain   . Eye disorder   . OSA (obstructive sleep apnea)    cpap   Relevant past medical, surgical, family and social history reviewed and updated as indicated. Interim medical history since our last visit reviewed. Allergies and medications reviewed and updated. DATA REVIEWED: CHART IN EPIC  Family History reviewed for pertinent findings.  Review of Systems  Constitutional: Negative.  Negative for appetite change and fatigue.  Eyes: Negative for pain and visual disturbance.  Respiratory: Negative.  Negative for cough, chest tightness, shortness of breath and wheezing.   Cardiovascular: Negative.  Negative for chest pain, palpitations and leg swelling.  Gastrointestinal: Negative.  Negative for abdominal pain, diarrhea, nausea and vomiting.  Genitourinary: Negative.   Skin: Negative.  Negative for color change and rash.  Neurological: Negative.  Negative for weakness, numbness and headaches.  Psychiatric/Behavioral: Negative.     Allergies as of 05/09/2018     No Known Allergies     Medication List        Accurate as of 05/09/18 11:59 PM. Always use your most recent med list.          ALPRAZolam 1 MG tablet Commonly known as:  XANAX Take 1 tablet (1 mg total) by mouth 2 (two) times daily as needed.   DRY EYES OP Place 2 drops into both eyes as needed (for dry eyes).   HYDROcodone-acetaminophen 10-325 MG tablet Commonly known as:  NORCO Take 1 tablet by mouth every 8 (eight) hours as needed.   HYDROcodone-acetaminophen 10-325 MG tablet Commonly known as:  NORCO Take 1 tablet by mouth every 6 (six) hours as needed.   HYDROcodone-acetaminophen 10-325 MG tablet Commonly known as:  NORCO Take 1 tablet by mouth every 6 (six) hours as needed.   naproxen 500 MG tablet Commonly known as:  NAPROSYN Take 1 tablet (500 mg total) by mouth 2 (two) times daily with a meal.          Objective:    BP 107/67   Pulse 68   Temp (!) 97.1 F (36.2 C) (Oral)   Ht 5' 8"  (1.727 m)   Wt 223 lb 6.4 oz (101.3 kg)   BMI 33.97 kg/m   No Known Allergies  Wt Readings from Last 3 Encounters:  05/09/18 223 lb 6.4 oz (101.3  kg)  02/13/18 227 lb 12.8 oz (103.3 kg)  08/15/17 227 lb 12.8 oz (103.3 kg)    Physical Exam  Constitutional: He appears well-developed and well-nourished. No distress.  HENT:  Head: Normocephalic and atraumatic.  Eyes: Pupils are equal, round, and reactive to light. Conjunctivae and EOM are normal.  Cardiovascular: Normal rate, regular rhythm and normal heart sounds.  Pulmonary/Chest: Effort normal and breath sounds normal. No respiratory distress.  Skin: Skin is warm and dry.  Psychiatric: He has a normal mood and affect. His behavior is normal.  Nursing note and vitals reviewed.   Results for orders placed or performed in visit on 02/13/18  CBC with Differential/Platelet  Result Value Ref Range   WBC 6.2 3.4 - 10.8 x10E3/uL   RBC 4.95 4.14 - 5.80 x10E6/uL   Hemoglobin 15.8 13.0 - 17.7 g/dL   Hematocrit 46.1  37.5 - 51.0 %   MCV 93 79 - 97 fL   MCH 31.9 26.6 - 33.0 pg   MCHC 34.3 31.5 - 35.7 g/dL   RDW 13.6 12.3 - 15.4 %   Platelets 162 150 - 450 x10E3/uL   Neutrophils 58 Not Estab. %   Lymphs 31 Not Estab. %   Monocytes 9 Not Estab. %   Eos 2 Not Estab. %   Basos 0 Not Estab. %   Neutrophils Absolute 3.5 1.4 - 7.0 x10E3/uL   Lymphocytes Absolute 1.9 0.7 - 3.1 x10E3/uL   Monocytes Absolute 0.6 0.1 - 0.9 x10E3/uL   EOS (ABSOLUTE) 0.2 0.0 - 0.4 x10E3/uL   Basophils Absolute 0.0 0.0 - 0.2 x10E3/uL   Immature Granulocytes 0 Not Estab. %   Immature Grans (Abs) 0.0 0.0 - 0.1 x10E3/uL  Lipid panel  Result Value Ref Range   Cholesterol, Total 142 100 - 199 mg/dL   Triglycerides 88 0 - 149 mg/dL   HDL 41 >39 mg/dL   VLDL Cholesterol Cal 18 5 - 40 mg/dL   LDL Calculated 83 0 - 99 mg/dL   Chol/HDL Ratio 3.5 0.0 - 5.0 ratio  CMP14+EGFR  Result Value Ref Range   Glucose 86 65 - 99 mg/dL   BUN 17 6 - 24 mg/dL   Creatinine, Ser 0.88 0.76 - 1.27 mg/dL   GFR calc non Af Amer 99 >59 mL/min/1.73   GFR calc Af Amer 115 >59 mL/min/1.73   BUN/Creatinine Ratio 19 9 - 20   Sodium 140 134 - 144 mmol/L   Potassium 4.3 3.5 - 5.2 mmol/L   Chloride 103 96 - 106 mmol/L   CO2 24 20 - 29 mmol/L   Calcium 8.7 8.7 - 10.2 mg/dL   Total Protein 7.0 6.0 - 8.5 g/dL   Albumin 4.5 3.5 - 5.5 g/dL   Globulin, Total 2.5 1.5 - 4.5 g/dL   Albumin/Globulin Ratio 1.8 1.2 - 2.2   Bilirubin Total 1.7 (H) 0.0 - 1.2 mg/dL   Alkaline Phosphatase 61 39 - 117 IU/L   AST 13 0 - 40 IU/L   ALT 21 0 - 44 IU/L  TSH  Result Value Ref Range   TSH 2.040 0.450 - 4.500 uIU/mL  PSA  Result Value Ref Range   Prostate Specific Ag, Serum 0.9 0.0 - 4.0 ng/mL      Assessment & Plan:   1. Chronic cervical pain - HYDROcodone-acetaminophen (NORCO) 10-325 MG tablet; Take 1 tablet by mouth every 8 (eight) hours as needed.  Dispense: 30 tablet; Refill: 0 - HYDROcodone-acetaminophen (NORCO) 10-325 MG tablet; Take 1 tablet by mouth  every 6  (six) hours as needed.  Dispense: 30 tablet; Refill: 0 - HYDROcodone-acetaminophen (NORCO) 10-325 MG tablet; Take 1 tablet by mouth every 6 (six) hours as needed.  Dispense: 30 tablet; Refill: 0  2. Chronic bilateral low back pain without sciatica - HYDROcodone-acetaminophen (NORCO) 10-325 MG tablet; Take 1 tablet by mouth every 8 (eight) hours as needed.  Dispense: 30 tablet; Refill: 0 - HYDROcodone-acetaminophen (NORCO) 10-325 MG tablet; Take 1 tablet by mouth every 6 (six) hours as needed.  Dispense: 30 tablet; Refill: 0 - HYDROcodone-acetaminophen (NORCO) 10-325 MG tablet; Take 1 tablet by mouth every 6 (six) hours as needed.  Dispense: 30 tablet; Refill: 0   Continue all other maintenance medications as listed above.  Follow up plan: No follow-ups on file.  Educational handout given for [place] PA-C [facility] [address]  [place], [place] [address] [phone]   05/10/2018, 6:06 PM

## 2018-08-18 ENCOUNTER — Encounter: Payer: Self-pay | Admitting: Physician Assistant

## 2018-08-18 ENCOUNTER — Ambulatory Visit (INDEPENDENT_AMBULATORY_CARE_PROVIDER_SITE_OTHER): Payer: Medicare Other | Admitting: Physician Assistant

## 2018-08-18 VITALS — BP 95/56 | HR 75 | Ht 68.0 in | Wt 225.2 lb

## 2018-08-18 DIAGNOSIS — F411 Generalized anxiety disorder: Secondary | ICD-10-CM

## 2018-08-18 DIAGNOSIS — F1190 Opioid use, unspecified, uncomplicated: Secondary | ICD-10-CM | POA: Diagnosis not present

## 2018-08-18 DIAGNOSIS — M533 Sacrococcygeal disorders, not elsewhere classified: Secondary | ICD-10-CM

## 2018-08-18 DIAGNOSIS — M542 Cervicalgia: Secondary | ICD-10-CM | POA: Diagnosis not present

## 2018-08-18 DIAGNOSIS — M545 Low back pain, unspecified: Secondary | ICD-10-CM

## 2018-08-18 DIAGNOSIS — G8929 Other chronic pain: Secondary | ICD-10-CM

## 2018-08-18 MED ORDER — HYDROCODONE-ACETAMINOPHEN 10-325 MG PO TABS: 1.0000 | ORAL_TABLET | Freq: Four times a day (QID) | ORAL | 0 refills | Status: DC | PRN

## 2018-08-18 MED ORDER — GABAPENTIN 100 MG PO CAPS: 100.0000 mg | ORAL_CAPSULE | Freq: Every day | ORAL | 5 refills | Status: DC

## 2018-08-18 MED ORDER — ALPRAZOLAM 1 MG PO TABS: 1.0000 mg | ORAL_TABLET | Freq: Two times a day (BID) | ORAL | 5 refills | Status: DC | PRN

## 2018-08-18 MED ORDER — HYDROCODONE-ACETAMINOPHEN 10-325 MG PO TABS: 1.0000 | ORAL_TABLET | Freq: Three times a day (TID) | ORAL | 0 refills | Status: DC | PRN

## 2018-08-18 NOTE — Progress Notes (Addendum)
BP (!) 95/56   Pulse 75   Ht _0  (1.727 m)   Wt 225 lb 3.2 oz (102.2 kg)   BMI 34.24 kg/m    Subjective:    Patient ID: Roberto Hanson, male    DOB: 28-Dec-1965, 53 y.o.   MRN: [id number]  HPI: Roberto Hanson is a 53 y.o. male presenting on 08/18/2018 for No chief complaint on file.  Date: 08/18/2018 Comes in for a recheck on chronic medications.  Diagnosis: Chronic back pain and  GAD  Radiologic evidence or specialist: getting records from previous radiological studies  Previous treatments, contraindicated: naproxen, ultram, muscle relaxants. Physical therapy. Few years ago he was referred to [facility] but was very long time out for visit.   Social history, med history, history of overdoses or hospitalization: no risks seen, no OD  Pain score with and without prescription: 5  Urine drug screen: 08/18/2018  Controlled substance contract: on file  Narcan: ordered  MME total: 42 MME on a rare basis LME total:2 LME  Past Medical History:  Diagnosis Date  . Anxiety   . Chronic back pain   . Chronic neck pain   . Eye disorder   . OSA (obstructive sleep apnea)    cpap   Relevant past medical, surgical, family and social history reviewed and updated as indicated. Interim medical history since our last visit reviewed. Allergies and medications reviewed and updated. DATA REVIEWED: CHART IN EPIC  Family History reviewed for pertinent findings.  Review of Systems  Constitutional: Negative.  Negative for appetite change and fatigue.  HENT: Negative.   Eyes: Negative.  Negative for pain and visual disturbance.  Respiratory: Negative.  Negative for cough, chest tightness, shortness of breath and wheezing.   Cardiovascular: Negative.  Negative for chest pain, palpitations and leg swelling.  Gastrointestinal: Negative.  Negative for abdominal pain, diarrhea, nausea and vomiting.  Endocrine: Negative.   Genitourinary: Negative.   Musculoskeletal: Positive  for arthralgias and myalgias.  Skin: Negative.  Negative for color change and rash.  Neurological: Negative.  Negative for weakness, numbness and headaches.  Psychiatric/Behavioral: Negative.     Allergies as of 08/18/2018   No Known Allergies     Medication List       Accurate as of August 18, 2018  9:49 PM. Always use your most recent med list.        ALPRAZolam 1 MG tablet Commonly known as:  XANAX Take 1 tablet (1 mg total) by mouth 2 (two) times daily as needed.   DRY EYES OP Place 2 drops into both eyes as needed (for dry eyes).   gabapentin 100 MG capsule Commonly known as:  NEURONTIN Take 1-3 capsules (100-300 mg total) by mouth at bedtime.   HYDROcodone-acetaminophen 10-325 MG tablet Commonly known as:  NORCO Take 1 tablet by mouth every 8 (eight) hours as needed.   HYDROcodone-acetaminophen 10-325 MG tablet Commonly known as:  NORCO Take 1 tablet by mouth every 6 (six) hours as needed.   HYDROcodone-acetaminophen 10-325 MG tablet Commonly known as:  NORCO Take 1 tablet by mouth every 6 (six) hours as needed.   naproxen 500 MG tablet Commonly known as:  NAPROSYN Take 1 tablet (500 mg total) by mouth 2 (two) times daily with a meal.          Objective:    BP (!) 95/56   Pulse 75   Ht _1  (1.727 m)   Wt 225 lb 3.2  oz (102.2 kg)   BMI 34.24 kg/m   No Known Allergies  Wt Readings from Last 3 Encounters:  08/18/18 225 lb 3.2 oz (102.2 kg)  05/09/18 223 lb 6.4 oz (101.3 kg)  02/13/18 227 lb 12.8 oz (103.3 kg)    Physical Exam Vitals signs and nursing note reviewed.  Constitutional:      General: He is not in acute distress.    Appearance: He is well-developed.  HENT:     Head: Normocephalic and atraumatic.  Eyes:     Conjunctiva/sclera: Conjunctivae normal.     Pupils: Pupils are equal, round, and reactive to light.  Cardiovascular:     Rate and Rhythm: Normal rate and regular rhythm.     Heart sounds: Normal heart sounds.  Pulmonary:      Effort: Pulmonary effort is normal. No respiratory distress.     Breath sounds: Normal breath sounds.  Musculoskeletal:     Lumbar back: He exhibits decreased range of motion, pain and spasm.       Back:  Skin:    General: Skin is warm and dry.  Psychiatric:        Behavior: Behavior normal.     Results for orders placed or performed in visit on 02/13/18  CBC with Differential/Platelet  Result Value Ref Range   WBC 6.2 3.4 - 10.8 x10E3/uL   RBC 4.95 4.14 - 5.80 x10E6/uL   Hemoglobin 15.8 13.0 - 17.7 g/dL   Hematocrit 46.1 37.5 - 51.0 %   MCV 93 79 - 97 fL   MCH 31.9 26.6 - 33.0 pg   MCHC 34.3 31.5 - 35.7 g/dL   RDW 13.6 12.3 - 15.4 %   Platelets 162 150 - 450 x10E3/uL   Neutrophils 58 Not Estab. %   Lymphs 31 Not Estab. %   Monocytes 9 Not Estab. %   Eos 2 Not Estab. %   Basos 0 Not Estab. %   Neutrophils Absolute 3.5 1.4 - 7.0 x10E3/uL   Lymphocytes Absolute 1.9 0.7 - 3.1 x10E3/uL   Monocytes Absolute 0.6 0.1 - 0.9 x10E3/uL   EOS (ABSOLUTE) 0.2 0.0 - 0.4 x10E3/uL   Basophils Absolute 0.0 0.0 - 0.2 x10E3/uL   Immature Granulocytes 0 Not Estab. %   Immature Grans (Abs) 0.0 0.0 - 0.1 x10E3/uL  Lipid panel  Result Value Ref Range   Cholesterol, Total 142 100 - 199 mg/dL   Triglycerides 88 0 - 149 mg/dL   HDL 41 >39 mg/dL   VLDL Cholesterol Cal 18 5 - 40 mg/dL   LDL Calculated 83 0 - 99 mg/dL   Chol/HDL Ratio 3.5 0.0 - 5.0 ratio  CMP14+EGFR  Result Value Ref Range   Glucose 86 65 - 99 mg/dL   BUN 17 6 - 24 mg/dL   Creatinine, Ser 0.88 0.76 - 1.27 mg/dL   GFR calc non Af Amer 99 >59 mL/min/1.73   GFR calc Af Amer 115 >59 mL/min/1.73   BUN/Creatinine Ratio 19 9 - 20   Sodium 140 134 - 144 mmol/L   Potassium 4.3 3.5 - 5.2 mmol/L   Chloride 103 96 - 106 mmol/L   CO2 24 20 - 29 mmol/L   Calcium 8.7 8.7 - 10.2 mg/dL   Total Protein 7.0 6.0 - 8.5 g/dL   Albumin 4.5 3.5 - 5.5 g/dL   Globulin, Total 2.5 1.5 - 4.5 g/dL   Albumin/Globulin Ratio 1.8 1.2 - 2.2   Bilirubin  Total 1.7 (H) 0.0 - 1.2 mg/dL  Alkaline Phosphatase 61 39 - 117 IU/L   AST 13 0 - 40 IU/L   ALT 21 0 - 44 IU/L  TSH  Result Value Ref Range   TSH 2.040 0.450 - 4.500 uIU/mL  PSA  Result Value Ref Range   Prostate Specific Ag, Serum 0.9 0.0 - 4.0 ng/mL      Assessment & Plan:   1. Chronic bilateral low back pain without sciatica - HYDROcodone-acetaminophen (NORCO) 10-325 MG tablet; Take 1 tablet by mouth every 8 (eight) hours as needed.  Dispense: 30 tablet; Refill: 0 - HYDROcodone-acetaminophen (NORCO) 10-325 MG tablet; Take 1 tablet by mouth every 6 (six) hours as needed.  Dispense: 30 tablet; Refill: 0 - HYDROcodone-acetaminophen (NORCO) 10-325 MG tablet; Take 1 tablet by mouth every 6 (six) hours as needed.  Dispense: 30 tablet; Refill: 0 - ToxASSURE Select 13 (MW), Urine  2. Chronic narcotic use - ToxASSURE Select 13 (MW), Urine  3. Chronic cervical pain - HYDROcodone-acetaminophen (NORCO) 10-325 MG tablet; Take 1 tablet by mouth every 8 (eight) hours as needed.  Dispense: 30 tablet; Refill: 0 - HYDROcodone-acetaminophen (NORCO) 10-325 MG tablet; Take 1 tablet by mouth every 6 (six) hours as needed.  Dispense: 30 tablet; Refill: 0 - HYDROcodone-acetaminophen (NORCO) 10-325 MG tablet; Take 1 tablet by mouth every 6 (six) hours as needed.  Dispense: 30 tablet; Refill: 0  4. Chronic SI joint pain GABApentin 100 mg 1-3 tab at bedtime Goal of reducing pain med to a minimal level  5. GAD (generalized anxiety disorder) - ALPRAZolam (XANAX) 1 MG tablet; Take 1 tablet (1 mg total) by mouth 2 (two) times daily as needed.  Dispense: 60 tablet; Refill: 5 WORK on SSRI or cymbalta in coming visit, or try buspar. - ToxASSURE Select 13 (MW), Urine   Continue all other maintenance medications as listed above.  Follow up plan: Return in about 6 months (around 02/18/2019).  Educational handout given for [place] PA-C [place] [address]   [place], [place] [address] [phone]   08/18/2018, 9:49 PM

## 2018-08-23 LAB — TOXASSURE SELECT 13 (MW), URINE

## 2018-09-09 ENCOUNTER — Other Ambulatory Visit: Payer: Self-pay | Admitting: Physician Assistant

## 2018-09-09 NOTE — Telephone Encounter (Signed)
Please advise 

## 2018-09-19 ENCOUNTER — Encounter: Payer: Medicare Other | Admitting: *Deleted

## 2018-09-22 ENCOUNTER — Other Ambulatory Visit: Payer: Self-pay | Admitting: Physician Assistant

## 2018-09-22 DIAGNOSIS — M542 Cervicalgia: Principal | ICD-10-CM

## 2018-09-22 DIAGNOSIS — M545 Low back pain, unspecified: Secondary | ICD-10-CM

## 2018-09-22 DIAGNOSIS — G8929 Other chronic pain: Secondary | ICD-10-CM

## 2018-09-23 MED ORDER — HYDROCODONE-ACETAMINOPHEN 10-325 MG PO TABS: 1.0000 | ORAL_TABLET | Freq: Four times a day (QID) | ORAL | 0 refills | Status: DC | PRN

## 2018-09-23 NOTE — Telephone Encounter (Signed)
Left voicemail for patient asking him to call back regarding which medications he needs sent to [facility] in [address].

## 2018-10-17 ENCOUNTER — Other Ambulatory Visit: Payer: Self-pay

## 2018-10-17 ENCOUNTER — Ambulatory Visit (INDEPENDENT_AMBULATORY_CARE_PROVIDER_SITE_OTHER): Payer: Medicare Other | Admitting: *Deleted

## 2018-10-17 DIAGNOSIS — Z0000 Encounter for general adult medical examination without abnormal findings: Secondary | ICD-10-CM

## 2018-10-17 NOTE — Progress Notes (Signed)
MEDICARE ANNUAL WELLNESS VISIT  10/17/2018  Telephone Visit Disclaimer This Medicare AWV was conducted by telephone due to national recommendations for restrictions regarding the COVID-19 Pandemic (e.g. social distancing).  I verified, using two identifiers, that I am speaking with Roberto Hanson or their authorized healthcare agent. I discussed the limitations, risks, security, and privacy concerns of performing an evaluation and management service by telephone and the potential availability of an in-person appointment in the future. The patient expressed understanding and agreed to proceed.   Subjective:  Roberto Hanson is a 53 y.o. male patient of Roberto Sleeper, PA-C who had a Medicare Annual Wellness Visit today via telephone. Plumer is Working full time and lives alone. he has 1 child. he reports that he is socially active and does interact with friends/family regularly. he is minimally physically active and enjoys music.  Patient Care Team: [name] as PCP - General (Physician Assistant)  Advanced Directives 05/16/2017 04/23/2017 11/16/2016  Does Patient Have a Medical Advance Directive? No No No  Would patient like information on creating a medical advance directive? No - Patient declined - Yes (MAU/Ambulatory/Procedural Areas - Information given)    Hospital Utilization Over the Past 12 Months: # of hospitalizations or ER visits: 0 # of surgeries: 0  Review of Systems    Patient reports that his overall health is unchanged compared to last year.  Patient Reported Readings (BP, Pulse, CBG, Weight, etc) none  Review of Systems: History obtained from chart review and the patient General ROS: negative  All other systems negative.  Pain Assessment Pain Score: 4  Pain Type: Chronic pain Pain Location: Back Pain Orientation: Lower Pain Radiating Towards: left leg Pain Descriptors / Indicators: Radiating Pain Onset: More than a month ago Pain  Frequency: Constant Pain Relieving Factors: Hydrocodone, Gabapentin  Pain Relieving Factors: Hydrocodone, Gabapentin  Current Medications & Allergies (verified) Allergies as of 10/17/2018   No Known Allergies     Medication List       Accurate as of Oct 17, 2018  2:42 PM. Always use your most recent med list.        ALPRAZolam 1 MG tablet Commonly known as:  XANAX Take 1 tablet (1 mg total) by mouth 2 (two) times daily as needed.   DRY EYES OP Place 2 drops into both eyes as needed (for dry eyes).   gabapentin 100 MG capsule Commonly known as:  NEURONTIN TAKE 1-3 CAPSULES (100-300 MG TOTAL) BY MOUTH AT BEDTIME.   HYDROcodone-acetaminophen 10-325 MG tablet Commonly known as:  NORCO Take 1 tablet by mouth every 8 (eight) hours as needed.   HYDROcodone-acetaminophen 10-325 MG tablet Commonly known as:  NORCO Take 1 tablet by mouth every 6 (six) hours as needed.   HYDROcodone-acetaminophen 10-325 MG tablet Commonly known as:  NORCO Take 1 tablet by mouth every 6 (six) hours as needed.   naproxen 500 MG tablet Commonly known as:  Naprosyn Take 1 tablet (500 mg total) by mouth 2 (two) times daily with a meal.       History (reviewed): Past Medical History:  Diagnosis Date  . Anxiety   . Chronic back pain   . Chronic neck pain   . Eye disorder   . OSA (obstructive sleep apnea)    cpap   Past Surgical History:  Procedure Laterality Date  . COLONOSCOPY WITH PROPOFOL N/A 05/22/2017   Procedure: COLONOSCOPY WITH PROPOFOL;  Surgeon: [name], MD;  Location: [facility];  Service: Endoscopy;  Laterality: N/A;  12:00pm  . EYE SURGERY    . POLYPECTOMY  05/22/2017   Procedure: POLYPECTOMY;  Surgeon: [name], MD;  Location: [facility];  Service: Endoscopy;;  colon   Family History  Problem Relation Age of Onset  . Diabetes Mother   . Diabetes Father   . Diabetes Brother   . Glaucoma Sister   . Healthy Daughter   . Glaucoma Brother   . Diabetes  Brother   . Glaucoma Sister   . Colon cancer Neg Hx    Social History   Socioeconomic History  . Marital status: Legally Separated    Spouse name: Not on file  . Number of children: 1  . Years of education: Not on file  . Highest education level: 11th grade  Occupational History  . Occupation: employed  Scientific laboratory technician  . Financial resource strain: Not on file  . Food insecurity:    Worry: Never true    Inability: Never true  . Transportation needs:    Medical: No    Non-medical: No  Tobacco Use  . Smoking status: Former Smoker    Packs/day: 0.25    Years: 34.00    Pack years: 8.50    Types: Cigarettes    Last attempt to quit: 04/19/2015    Years since quitting: 3.4  . Smokeless tobacco: Never Used  Substance and Sexual Activity  . Alcohol use: No    Comment: rare  . Drug use: No  . Sexual activity: Not Currently  Lifestyle  . Physical activity:    Days per week: 0 days    Minutes per session: 0 min  . Stress: Not at all  Relationships  . Social connections:    Talks on phone: More than three times a week    Gets together: More than three times a week    Attends religious service: More than 4 times per year    Active member of club or organization: Yes    Attends meetings of clubs or organizations: More than 4 times per year    Relationship status: Separated  Other Topics Concern  . Not on file  Social History Narrative  . Not on file    Activities of Daily Living In your present state of health, do you have any difficulty performing the following activities: 10/17/2018  Hearing? N  Vision? Y  Difficulty concentrating or making decisions? N  Walking or climbing stairs? N  Dressing or bathing? N  Doing errands, shopping? N  Preparing Food and eating ? N  Using the Toilet? N  In the past six months, have you accidently leaked urine? N  Do you have problems with loss of bowel control? N  Managing your Medications? N  Managing your Finances? N  Housekeeping  or managing your Housekeeping? N  Some recent data might be hidden    Patient Literacy How often do you need to have someone help you when you read instructions, pamphlets, or other written materials from your doctor or pharmacy?: 5 - Always What is the last grade level you completed in school?: 11th grade  Exercise Current Exercise Habits: The patient does not participate in regular exercise at present  Diet Patient reports consuming 2 meals a day and 1 snack(s) a day Patient reports that his primary diet is: Regular Patient reports that she does have regular access to food.   Depression Screen PHQ 2/9 Scores 10/17/2018 08/18/2018 05/09/2018 02/13/2018 08/15/2017  07/05/2017 04/03/2017  PHQ - 2 Score 0 0 0 0 0 0 0     Fall Risk Fall Risk  10/17/2018 04/03/2017 11/16/2016 10/19/2016 09/03/2016  Falls in the past year? 0 No No No No     Objective:  ZAKARIYA KNICKERBOCKER seemed alert and oriented and he participated appropriately during our telephone visit.  Blood Pressure Weight BMI  BP Readings from Last 3 Encounters:  08/18/18 (!) 95/56  05/09/18 107/67  02/13/18 109/74   Wt Readings from Last 3 Encounters:  08/18/18 225 lb 3.2 oz (102.2 kg)  05/09/18 223 lb 6.4 oz (101.3 kg)  02/13/18 227 lb 12.8 oz (103.3 kg)   BMI Readings from Last 1 Encounters:  08/18/18 34.24 kg/m    *Unable to obtain current vital signs, weight, and BMI due to telephone visit type  Hearing/Vision  . Yonatan did not seem to have difficulty with hearing/understanding during the telephone conversation . Reports that he has had a formal eye exam by an eye care professional within the past year . Reports that he has not had a formal hearing evaluation within the past year *Unable to fully assess hearing and vision during telephone visit type  Cognitive Function: 6CIT Screen 10/17/2018  What Year? 0 points  What month? 0 points  What time? 0 points  Count back from 20 0 points  Months in reverse 0 points   Repeat phrase 0 points  Total Score 0    Normal Cognitive Function Screening: Yes (Normal:0-7, Significant for Dysfunction: >8)  Immunization & Health Maintenance Record Immunization History  Administered Date(s) Administered  . Tdap 11/16/2016    Health Maintenance  Topic Date Due  . HIV Screening  04/14/1981  . TETANUS/TDAP  11/17/2026  . COLONOSCOPY  05/23/2027  . INFLUENZA VACCINE  Discontinued       Assessment  This is a routine wellness examination for BAYLEY HURN.  Health Maintenance: Due or Overdue Health Maintenance Due  Topic Date Due  . HIV Screening  04[birth year]14/1981    Roberto Hanson does not need a referral for Community Assistance: Care Management:   no Social Work:    no Prescription Assistance:  no Nutrition/Diabetes Education:  no   Plan:  Personalized Goals Goals Addressed   None    Personalized Health Maintenance & Screening Recommendations  Advanced directives: has NO advanced directive  - add't info requested. Referral to SW: no Shingrix vaccine at next office visit   Lung Cancer Screening Recommended: no (Low Dose CT Chest recommended if Age 50-80 years, 30 pack-year currently smoking OR have quit w/in past 15 years) Hepatitis C Screening recommended: no HIV Screening recommended: no  Advanced Directives: Written information was prepared per patient's request.  Referrals & Orders No orders of the defined types were placed in this encounter.   Follow-up Plan . Follow-up with Roberto Sleeper, PA-C as planned   I have personally reviewed and noted the following in the patient's chart:   . Medical and social history . Use of alcohol, tobacco or illicit drugs  . Current medications and supplements . Functional ability and status . Nutritional status . Physical activity . Advanced directives . List of other physicians . Hospitalizations, surgeries, and ER visits in previous 12 months . Vitals . Screenings to include  cognitive, depression, and falls . Referrals and appointments  In addition, I have reviewed and discussed with Roberto Hanson certain preventive protocols, quality metrics, and best practice recommendations. A written personalized care plan  for preventive services as well as general preventive health recommendations is available and can be mailed to the patient at his request.      Truett Mainland, LPN signature  07/27/5745

## 2018-10-17 NOTE — Patient Instructions (Signed)
Roberto Hanson , Thank you for taking time to come for your Medicare Wellness Visit. I appreciate your ongoing commitment to your health goals. Please review the following plan we discussed and let me know if I can assist you in the future.   These are the goals we discussed: Goals     Exercise 3x per week (30 min per time)     Continue to exercise for at least 30 minutes daily.       This is a list of the screening recommended for you and due dates:  Health Maintenance  Topic Date Due   HIV Screening  04/14/1981   Tetanus Vaccine  11/17/2026   Colon Cancer Screening  05/23/2027   Flu Shot  Discontinued     Advance Directive  Advance directives are legal documents that let you make choices ahead of time about your health care and medical treatment in case you become unable to communicate for yourself. Advance directives are a way for you to communicate your wishes to family, friends, and health care providers. This can help convey your decisions about end-of-life care if you become unable to communicate. Discussing and writing advance directives should happen over time rather than all at once. Advance directives can be changed depending on your situation and what you want, even after you have signed the advance directives. If you do not have an advance directive, some states assign family decision makers to act on your behalf based on how closely you are related to them. Each state has its own laws regarding advance directives. You may want to check with your health care provider, attorney, or state representative about the laws in your state. There are different types of advance directives, such as:  Medical power of attorney.  Living will.  Do not resuscitate (DNR) or do not attempt resuscitation (DNAR) order. Health care proxy and medical power of attorney A health care proxy, also called a health care agent, is a person who is appointed to make medical decisions for you in cases  in which you are unable to make the decisions yourself. Generally, people choose someone they know well and trust to represent their preferences. Make sure to ask this person for an agreement to act as your proxy. A proxy may have to exercise judgment in the event of a medical decision for which your wishes are not known. A medical power of attorney is a legal document that names your health care proxy. Depending on the laws in your state, after the document is written, it may also need to be:  Signed.  Notarized.  Dated.  Copied.  Witnessed.  Incorporated into your medical record. You may also want to appoint someone to manage your financial affairs in a situation in which you are unable to do so. This is called a durable power of attorney for finances. It is a separate legal document from the durable power of attorney for health care. You may choose the same person or someone different from your health care proxy to act as your agent in financial matters. If you do not appoint a proxy, or if there is a concern that the proxy is not acting in your best interests, a court-appointed guardian may be designated to act on your behalf. Living will A living will is a set of instructions documenting your wishes about medical care when you cannot express them yourself. Health care providers should keep a copy of your living will in your medical record. You  may want to give a copy to family members or friends. To alert caregivers in case of an emergency, you can place a card in your wallet to let them know that you have a living will and where they can find it. A living will is used if you become:  Terminally ill.  Incapacitated.  Unable to communicate or make decisions. Items to consider in your living will include:  The use or non-use of life-sustaining equipment, such as dialysis machines and breathing machines (ventilators).  A DNR or DNAR order, which is the instruction not to use  cardiopulmonary resuscitation (CPR) if breathing or heartbeat stops.  The use or non-use of tube feeding.  Withholding of food and fluids.  Comfort (palliative) care when the goal becomes comfort rather than a cure.  Organ and tissue donation. A living will does not give instructions for distributing your money and property if you should pass away. It is recommended that you seek the advice of a lawyer when writing a will. Decisions about taxes, beneficiaries, and asset distribution will be legally binding. This process can relieve your family and friends of any concerns surrounding disputes or questions that may come up about the distribution of your assets. DNR or DNAR A DNR or DNAR order is a request not to have CPR in the event that your heart stops beating or you stop breathing. If a DNR or DNAR order has not been made and shared, a health care provider will try to help any patient whose heart has stopped or who has stopped breathing. If you plan to have surgery, talk with your health care provider about how your DNR or DNAR order will be followed if problems occur. Summary  Advance directives are the legal documents that allow you to make choices ahead of time about your health care and medical treatment in case you become unable to communicate for yourself.  The process of discussing and writing advance directives should happen over time. You can change the advance directives, even after you have signed them.  Advance directives include DNR or DNAR orders, living wills, and designating an agent as your medical power of attorney. This information is not intended to replace advice given to you by your health care provider. Make sure you discuss any questions you have with your health care provider. Document Released: 09/11/2007 Document Revised: 04/23/2016 Document Reviewed: 04/23/2016 Elsevier Interactive Patient Education  2019 [employer].

## 2019-02-18 ENCOUNTER — Ambulatory Visit: Payer: Medicare Other | Admitting: Physician Assistant

## 2019-08-13 ENCOUNTER — Other Ambulatory Visit: Payer: Self-pay | Admitting: Physician Assistant

## 2019-08-24 ENCOUNTER — Telehealth: Payer: Self-pay | Admitting: Physician Assistant

## 2019-08-24 NOTE — Telephone Encounter (Signed)
FYI

## 2019-08-24 NOTE — Telephone Encounter (Signed)
Lady from [employer] called to let Roberto Hanson know that she was sending over a request for patient to get a prescription for CPAP Supplies. She is faxing request to [place] @ [phone].

## 2019-08-25 NOTE — Telephone Encounter (Signed)
Vm left & faxed back that pt has not been seen since last March This was place on providers desk, not signed with the same response

## 2019-10-23 ENCOUNTER — Ambulatory Visit (INDEPENDENT_AMBULATORY_CARE_PROVIDER_SITE_OTHER): Payer: Medicare Other

## 2019-10-23 DIAGNOSIS — Z0000 Encounter for general adult medical examination without abnormal findings: Secondary | ICD-10-CM | POA: Diagnosis not present

## 2019-10-23 NOTE — Progress Notes (Signed)
MEDICARE ANNUAL WELLNESS VISIT  10/23/2019  Telephone Visit Disclaimer This Medicare AWV was conducted by telephone due to national recommendations for restrictions regarding the COVID-19 Pandemic (e.g. social distancing).  I verified, using two identifiers, that I am speaking with Roberto Hanson or their authorized healthcare agent. I discussed the limitations, risks, security, and privacy concerns of performing an evaluation and management service by telephone and the potential availability of an in-person appointment in the future. The patient expressed understanding and agreed to proceed.   Subjective:  Roberto Hanson is a 54 y.o. male patient of [name], PA-C who had a Medicare Annual Wellness Visit today via telephone. Roberto Hanson is Working full time and lives alone. he has one daughter. he reports that he is socially active and does interact with friends/family regularly. he is minimally physically active and enjoys golf.  Patient Care Team: [name] as PCP - General (Physician Assistant)  Advanced Directives 10/23/2019 05/16/2017 04/23/2017 11/16/2016  Does Patient Have a Medical Advance Directive? No No No No  Would patient like information on creating a medical advance directive? No - Patient declined No - Patient declined - Yes (MAU/Ambulatory/Procedural Areas - Information given)    Hospital Utilization Over the Past 12 Months: # of hospitalizations or ER visits: 0 # of surgeries: 0  Review of Systems    Patient reports that his overall health is unchanged compared to last year.  Negative except Patient has been without his medications since a Particia Nearing, PA  resigned from the practice. An appointment was scheduled today with Hendricks Limes, FNP. Pt had no preference on PCP's. He is aware that medications will not be sent at this time and he agreed with plan. Pt is scheduled in June.  Patient Reported Readings (BP, Pulse, CBG, Weight,  etc) none  Pain Assessment Pain : No/denies pain     Current Medications & Allergies (verified) Allergies as of 10/23/2019   No Known Allergies     Medication List       Accurate as of Oct 23, 2019  2:40 PM. If you have any questions, ask your nurse or doctor.        ALPRAZolam 1 MG tablet Commonly known as: XANAX Take 1 tablet (1 mg total) by mouth 2 (two) times daily as needed.   DRY EYES OP Place 2 drops into both eyes as needed (for dry eyes).   gabapentin 100 MG capsule Commonly known as: NEURONTIN TAKE 1-3 CAPSULES (100-300 MG TOTAL) BY MOUTH AT BEDTIME.   HYDROcodone-acetaminophen 10-325 MG tablet Commonly known as: NORCO Take 1 tablet by mouth every 8 (eight) hours as needed.   HYDROcodone-acetaminophen 10-325 MG tablet Commonly known as: NORCO Take 1 tablet by mouth every 6 (six) hours as needed.   HYDROcodone-acetaminophen 10-325 MG tablet Commonly known as: NORCO Take 1 tablet by mouth every 6 (six) hours as needed.   naproxen 500 MG tablet Commonly known as: Naprosyn Take 1 tablet (500 mg total) by mouth 2 (two) times daily with a meal.       History (reviewed): Past Medical History:  Diagnosis Date  . Anxiety   . Chronic back pain   . Chronic neck pain   . Eye disorder   . OSA (obstructive sleep apnea)    cpap   Past Surgical History:  Procedure Laterality Date  . COLONOSCOPY WITH PROPOFOL N/A 05/22/2017   Procedure: COLONOSCOPY WITH PROPOFOL;  Surgeon: [name], MD;  [facility];  Service: Endoscopy;  Laterality: N/A;  12:00pm  . EYE SURGERY    . POLYPECTOMY  05/22/2017   Procedure: POLYPECTOMY;  Surgeon: [name], MD;  Location: [facility];  Service: Endoscopy;;  colon   Family History  Problem Relation Age of Onset  . Diabetes Mother   . Diabetes Father   . Diabetes Brother   . Glaucoma Sister   . Healthy Daughter   . Glaucoma Brother   . Diabetes Brother   . Glaucoma Sister   . Colon cancer Neg Hx     Social History   Socioeconomic History  . Marital status: Legally Separated    Spouse name: Not on file  . Number of children: 1  . Years of education: Not on file  . Highest education level: 11th grade  Occupational History  . Occupation: employed  Tobacco Use  . Smoking status: Former Smoker    Packs/day: 0.25    Years: 34.00    Pack years: 8.50    Types: Cigarettes    Quit date: 04/19/2015    Years since quitting: 4.5  . Smokeless tobacco: Never Used  Substance and Sexual Activity  . Alcohol use: No    Comment: rare  . Drug use: No  . Sexual activity: Not Currently  Other Topics Concern  . Not on file  Social History Narrative  . Not on file   Social Determinants of Health   Financial Resource Strain:   . Difficulty of Paying Living Expenses:   Food Insecurity:   . Worried About Charity fundraiser in the Last Year:   . Arboriculturist in the Last Year:   Transportation Needs:   . Film/video editor (Medical):   Marland Kitchen Lack of Transportation (Non-Medical):   Physical Activity:   . Days of Exercise per Week:   . Minutes of Exercise per Session:   Stress:   . Feeling of Stress :   Social Connections:   . Frequency of Communication with Friends and Family:   . Frequency of Social Gatherings with Friends and Family:   . Attends Religious Services:   . Active Member of Clubs or Organizations:   . Attends Archivist Meetings:   Marland Kitchen Marital Status:     Activities of Daily Living In your present state of health, do you have any difficulty performing the following activities: 10/23/2019  Hearing? N  Vision? N  Difficulty concentrating or making decisions? N  Walking or climbing stairs? N  Dressing or bathing? N  Doing errands, shopping? N  Preparing Food and eating ? N  Using the Toilet? N  In the past six months, have you accidently leaked urine? N  Do you have problems with loss of bowel control? N  Managing your Medications? N  Managing your  Finances? N  Housekeeping or managing your Housekeeping? N  Some recent data might be hidden    Patient Education/ Literacy How often do you need to have someone help you when you read instructions, pamphlets, or other written materials from your doctor or pharmacy?: 1 - Never What is the last grade level you completed in school?: 11th grade  Exercise Current Exercise Habits: The patient has a physically strenuous job, but has no regular exercise apart from work.  Diet Patient reports consuming 2 meals a day and 2 snack(s) a day Patient reports that his primary diet is: Regular Patient reports that she does have regular access to  food.   Depression Screen PHQ 2/9 Scores 10/23/2019 10/17/2018 08/18/2018 05/09/2018 02/13/2018 08/15/2017 07/05/2017  PHQ - 2 Score 0 0 0 0 0 0 0     Fall Risk Fall Risk  10/23/2019 10/17/2018 04/03/2017 11/16/2016 10/19/2016  Falls in the past year? 0 0 No No No     Objective:  ISSAI KRUS seemed alert and oriented and he participated appropriately during our telephone visit.  Blood Pressure Weight BMI  BP Readings from Last 3 Encounters:  08/18/18 (!) 95/56  05/09/18 107/67  02/13/18 109/74   Wt Readings from Last 3 Encounters:  08/18/18 225 lb 3.2 oz (102.2 kg)  05/09/18 223 lb 6.4 oz (101.3 kg)  02/13/18 227 lb 12.8 oz (103.3 kg)   BMI Readings from Last 1 Encounters:  08/18/18 34.24 kg/m    *Unable to obtain current vital signs, weight, and BMI due to telephone visit type  Hearing/Vision  . Farmer did not seem to have difficulty with hearing/understanding during the telephone conversation . Reports that he has not had a formal eye exam by an eye care professional within the past year . Reports that he has not had a formal hearing evaluation within the past year *Unable to fully assess hearing and vision during telephone visit type  Cognitive Function: 6CIT Screen 10/23/2019 10/17/2018  What Year? 0 points 0 points  What month? 0 points 0  points  What time? 0 points 0 points  Count back from 20 0 points 0 points  Months in reverse 0 points 0 points  Repeat phrase 0 points 0 points  Total Score 0 0   (Normal:0-7, Significant for Dysfunction: >8)  Normal Cognitive Function Screening: Yes   Immunization & Health Maintenance Record Immunization History  Administered Date(s) Administered  . Tdap 11/16/2016    Health Maintenance  Topic Date Due  . HIV Screening  Never done  . COVID-19 Vaccine (1) Never done  . TETANUS/TDAP  11/17/2026  . COLONOSCOPY  05/23/2027  . INFLUENZA VACCINE  Discontinued       Assessment  This is a routine wellness examination for LILLY LOVICK.  Health Maintenance: Due or Overdue Health Maintenance Due  Topic Date Due  . HIV Screening  Never done  . COVID-19 Vaccine (1) Never done    Roberto Hanson does not need a referral for Community Assistance: Care Management:   no Social Work:    no Prescription Assistance:  no Nutrition/Diabetes Education:  no   Plan:  Personalized Goals Goals Addressed            This Visit's Progress   . Have 3 meals a day               . Patient Stated       He will contact Dr. Orvan Falconer office at [facility] to schedule his colonoscopy      Personalized Health Maintenance & Screening Recommendations   Lung Cancer Screening Recommended: no (Low Dose CT Chest recommended if Age 26-80 years, 30 pack-year currently smoking OR have quit w/in past 15 years) Hepatitis C Screening recommended: no HIV Screening recommended: no  Advanced Directives: Written information was not prepared per patient's request.  Referrals & Orders No orders of the defined types were placed in this encounter.   Follow-up Plan . Follow-up with Hendricks Limes FNP as planned . Scheduled 12/11/2019   I have personally reviewed and noted the following in the patient's chart:   . Medical and social history .  Use of alcohol, tobacco or illicit drugs   . Current medications and supplements . Functional ability and status . Nutritional status . Physical activity . Advanced directives . List of other physicians . Hospitalizations, surgeries, and ER visits in previous 12 months . Vitals . Screenings to include cognitive, depression, and falls . Referrals and appointments  In addition, I have reviewed and discussed with Roberto Hanson certain preventive protocols, quality metrics, and best practice recommendations. A written personalized care plan for preventive services as well as general preventive health recommendations is available and can be mailed to the patient at his request.      Maud Deed [facility]  [phone]

## 2019-10-23 NOTE — Patient Instructions (Signed)
  Home Maintenance Summary and Written Plan of Care  Roberto Hanson ,  Thank you for allowing me to perform your Medicare Annual Wellness Visit and for your ongoing commitment to your health.   Health Maintenance & Immunization History Health Maintenance  Topic Date Due  . HIV Screening  Never done  . COVID-19 Vaccine (1) Never done  . TETANUS/TDAP  11/17/2026  . COLONOSCOPY  05/23/2027  . INFLUENZA VACCINE  Discontinued   Immunization History  Administered Date(s) Administered  . Tdap 11/16/2016    These are the patient goals that we discussed: Goals Addressed            This Visit's Progress   . Have 3 meals a day               . Patient Stated       He will contact Dr. Orvan Falconer office at [facility] to schedule his colonoscopy        This is a list of Health Maintenance Items that are overdue or due now: Health Maintenance Due  Topic Date Due  . HIV Screening  Never done  . COVID-19 Vaccine (1) Never done     Orders/Referrals Placed Today: No orders of the defined types were placed in this encounter.  (Contact our referral department at [phone] if you have not spoken with someone about your referral appointment within the next 5 days)    Follow-up Plan  Scheduled with Hendricks Limes, FNP December 11, 2019 at 3:20pm

## 2019-12-11 ENCOUNTER — Other Ambulatory Visit: Payer: Self-pay

## 2019-12-11 ENCOUNTER — Ambulatory Visit (INDEPENDENT_AMBULATORY_CARE_PROVIDER_SITE_OTHER): Payer: Medicare Other | Admitting: Family Medicine

## 2019-12-11 ENCOUNTER — Encounter: Payer: Self-pay | Admitting: Family Medicine

## 2019-12-11 ENCOUNTER — Ambulatory Visit (INDEPENDENT_AMBULATORY_CARE_PROVIDER_SITE_OTHER): Payer: Medicare Other

## 2019-12-11 VITALS — BP 107/69 | HR 80 | Temp 98.2°F | Ht 68.0 in | Wt 250.0 lb

## 2019-12-11 DIAGNOSIS — M545 Low back pain: Secondary | ICD-10-CM

## 2019-12-11 DIAGNOSIS — M542 Cervicalgia: Secondary | ICD-10-CM | POA: Diagnosis not present

## 2019-12-11 DIAGNOSIS — Z13220 Encounter for screening for lipoid disorders: Secondary | ICD-10-CM

## 2019-12-11 DIAGNOSIS — F411 Generalized anxiety disorder: Secondary | ICD-10-CM

## 2019-12-11 DIAGNOSIS — Z136 Encounter for screening for cardiovascular disorders: Secondary | ICD-10-CM | POA: Diagnosis not present

## 2019-12-11 DIAGNOSIS — Z79899 Other long term (current) drug therapy: Secondary | ICD-10-CM

## 2019-12-11 DIAGNOSIS — G479 Sleep disorder, unspecified: Secondary | ICD-10-CM

## 2019-12-11 DIAGNOSIS — G8929 Other chronic pain: Secondary | ICD-10-CM | POA: Diagnosis not present

## 2019-12-11 MED ORDER — NAPROXEN 500 MG PO TABS: 500.0000 mg | ORAL_TABLET | Freq: Two times a day (BID) | ORAL | 3 refills | Status: DC

## 2019-12-11 MED ORDER — HYDROCODONE-ACETAMINOPHEN 7.5-325 MG PO TABS: 1.0000 | ORAL_TABLET | Freq: Two times a day (BID) | ORAL | 0 refills | Status: DC | PRN

## 2019-12-11 NOTE — Patient Instructions (Addendum)
  Unisom, Melatonin (3-10 mg) or Zzzquil - try these for sleep.
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
## 2019-12-12 DIAGNOSIS — Z79899 Other long term (current) drug therapy: Secondary | ICD-10-CM | POA: Insufficient documentation

## 2019-12-12 LAB — CMP14+EGFR
ALT: 28 IU/L (ref 0–44)
AST: 19 IU/L (ref 0–40)
Albumin/Globulin Ratio: 1.5 (ref 1.2–2.2)
Albumin: 4.3 g/dL (ref 3.8–4.9)
Alkaline Phosphatase: 64 IU/L (ref 48–121)
BUN/Creatinine Ratio: 18 (ref 9–20)
BUN: 19 mg/dL (ref 6–24)
Bilirubin Total: 0.5 mg/dL (ref 0.0–1.2)
CO2: 21 mmol/L (ref 20–29)
Calcium: 8.6 mg/dL — ABNORMAL LOW (ref 8.7–10.2)
Chloride: 108 mmol/L — ABNORMAL HIGH (ref 96–106)
Creatinine, Ser: 1.08 mg/dL (ref 0.76–1.27)
GFR calc Af Amer: 90 mL/min/{1.73_m2} (ref >59)
GFR calc non Af Amer: 78 mL/min/{1.73_m2} (ref >59)
Globulin, Total: 2.8 g/dL (ref 1.5–4.5)
Glucose: 92 mg/dL (ref 65–99)
Potassium: 4 mmol/L (ref 3.5–5.2)
Sodium: 143 mmol/L (ref 134–144)
Total Protein: 7.1 g/dL (ref 6.0–8.5)

## 2019-12-12 LAB — CBC WITH DIFFERENTIAL/PLATELET
Basophils Absolute: 0 10*3/uL (ref 0.0–0.2)
Basos: 0 %
EOS (ABSOLUTE): 0.1 10*3/uL (ref 0.0–0.4)
Eos: 1 %
Hematocrit: 43.2 % (ref 37.5–51.0)
Hemoglobin: 15.1 g/dL (ref 13.0–17.7)
Immature Grans (Abs): 0 10*3/uL (ref 0.0–0.1)
Immature Granulocytes: 0 %
Lymphocytes Absolute: 2.7 10*3/uL (ref 0.7–3.1)
Lymphs: 28 %
MCH: 31.7 pg (ref 26.6–33.0)
MCHC: 35 g/dL (ref 31.5–35.7)
MCV: 91 fL (ref 79–97)
Monocytes Absolute: 0.7 10*3/uL (ref 0.1–0.9)
Monocytes: 8 %
Neutrophils Absolute: 6.1 10*3/uL (ref 1.4–7.0)
Neutrophils: 63 %
Platelets: 167 10*3/uL (ref 150–450)
RBC: 4.76 x10E6/uL (ref 4.14–5.80)
RDW: 12.6 % (ref 11.6–15.4)
WBC: 9.7 10*3/uL (ref 3.4–10.8)

## 2019-12-12 LAB — LIPID PANEL
Chol/HDL Ratio: 4.6 ratio (ref 0.0–5.0)
Cholesterol, Total: 178 mg/dL (ref 100–199)
HDL: 39 mg/dL — ABNORMAL LOW (ref >39)
LDL Chol Calc (NIH): 114 mg/dL — ABNORMAL HIGH (ref 0–99)
Triglycerides: 141 mg/dL (ref 0–149)
VLDL Cholesterol Cal: 25 mg/dL (ref 5–40)

## 2019-12-17 ENCOUNTER — Other Ambulatory Visit: Payer: Self-pay | Admitting: Family Medicine

## 2019-12-17 DIAGNOSIS — M545 Low back pain, unspecified: Secondary | ICD-10-CM

## 2019-12-18 LAB — COMPLIANCE DRUG ANALYSIS, UR

## 2019-12-30 ENCOUNTER — Telehealth: Payer: Self-pay | Admitting: Family Medicine

## 2019-12-30 DIAGNOSIS — M545 Low back pain, unspecified: Secondary | ICD-10-CM

## 2019-12-30 NOTE — Telephone Encounter (Signed)
Referral placed to ortho

## 2020-05-17 ENCOUNTER — Encounter: Payer: Self-pay | Admitting: Internal Medicine

## 2020-11-25 ENCOUNTER — Ambulatory Visit (INDEPENDENT_AMBULATORY_CARE_PROVIDER_SITE_OTHER): Payer: Medicare Other | Admitting: Nurse Practitioner

## 2020-11-25 ENCOUNTER — Other Ambulatory Visit: Payer: Self-pay

## 2020-11-25 VITALS — BP 101/61 | HR 67 | Temp 98.0°F | Ht 68.0 in | Wt 245.0 lb

## 2020-11-25 DIAGNOSIS — L299 Pruritus, unspecified: Secondary | ICD-10-CM | POA: Diagnosis not present

## 2020-11-25 MED ORDER — HYDROCORTISONE 1 % EX LOTN: 1.0000 "application " | TOPICAL_LOTION | Freq: Two times a day (BID) | CUTANEOUS | 0 refills | Status: DC

## 2020-11-25 NOTE — Progress Notes (Signed)
Acute Office Visit  Subjective:    Patient ID: Roberto Hanson, male    DOB: February 11, 1966, 55 y.o.   MRN: [id number]  Chief Complaint  Patient presents with   Pruritus    Rash This is a new problem. The current episode started in the past 7 days. The problem is unchanged. The rash is diffuse. The rash is characterized by dryness and itchiness. Pertinent negatives include no congestion, cough or fever. Past treatments include nothing. The treatment provided no relief.      Past Medical History:  Diagnosis Date   Anxiety    Chronic back pain    Chronic neck pain    Eye disorder    OSA (obstructive sleep apnea)    cpap    Past Surgical History:  Procedure Laterality Date   COLONOSCOPY WITH PROPOFOL N/A 05/22/2017   Procedure: COLONOSCOPY WITH PROPOFOL;  Surgeon: [name], MD;  [facility]: [facility];  Service: Endoscopy;  Laterality: N/A;  12:00pm   EYE SURGERY     POLYPECTOMY  05/22/2017   Procedure: POLYPECTOMY;  Surgeon: [name], MD;  Location: [facility];  Service: Endoscopy;;  colon    Family History  Problem Relation Age of Onset   Diabetes Mother    Diabetes Father    Diabetes Brother    Glaucoma Sister    Healthy Daughter    Glaucoma Brother    Diabetes Brother    Glaucoma Sister    Colon cancer Neg Hx     Social History   Socioeconomic History   Marital status: Legally Separated    Spouse name: Not on file   Number of children: 1   Years of education: Not on file   Highest education level: 11th grade  Occupational History   Occupation: employed  Tobacco Use   Smoking status: Former    Packs/day: 0.25    Years: 34.00    Pack years: 8.50    Types: Cigarettes    Quit date: 04/19/2015    Years since quitting: 5.6   Smokeless tobacco: Never  Vaping Use   Vaping Use: Never used  Substance and Sexual Activity   Alcohol use: No    Comment: rare   Drug use: No   Sexual activity: Not Currently  Other Topics Concern   Not on  file  Social History Narrative   Not on file   Social Determinants of Health   Financial Resource Strain: Not on file  Food Insecurity: Not on file  Transportation Needs: Not on file  Physical Activity: Not on file  Stress: Not on file  Social Connections: Not on file  Intimate Partner Violence: Not on file    Outpatient Medications Prior to Visit  Medication Sig Dispense Refill   naproxen (NAPROSYN) 500 MG tablet Take 1 tablet (500 mg total) by mouth 2 (two) times daily with a meal. 180 tablet 3   Artificial Tear Ointment (DRY EYES OP) Place 2 drops into both eyes as needed (for dry eyes).     HYDROcodone-acetaminophen (NORCO) 7.5-325 MG tablet Take 1 tablet by mouth 2 (two) times daily as needed for moderate pain. 30 tablet 0   HYDROcodone-acetaminophen (NORCO) 7.5-325 MG tablet Take 1 tablet by mouth 2 (two) times daily as needed for moderate pain. 30 tablet 0   HYDROcodone-acetaminophen (NORCO) 7.5-325 MG tablet Take 1 tablet by mouth 2 (two) times daily as needed for moderate pain. 30 tablet 0   No facility-administered medications prior to  visit.    No Known Allergies  Review of Systems  Constitutional: Negative.  Negative for fever.  HENT:  Negative for congestion.   Respiratory:  Negative for cough.   Gastrointestinal: Negative.   Skin:  Positive for rash.  All other systems reviewed and are negative.     Objective:    Physical Exam Vitals and nursing note reviewed. Exam conducted with a chaperone present (friend).  HENT:     Head: Normocephalic.  Eyes:     Conjunctiva/sclera: Conjunctivae normal.  Cardiovascular:     Rate and Rhythm: Normal rate and regular rhythm.  Pulmonary:     Effort: Pulmonary effort is normal.     Breath sounds: Normal breath sounds.  Abdominal:     General: Bowel sounds are normal.  Skin:    Findings: No erythema or rash.  Neurological:     Mental Status: He is oriented to person, place, and time.  Psychiatric:        Behavior:  Behavior normal.    BP 101/61   Pulse 67   Temp 98 F (36.7 C) (Temporal)   Ht 5\' 8"  (1.727 m)   Wt 245 lb (111.1 kg)   SpO2 96%   BMI 37.25 kg/m  Wt Readings from Last 3 Encounters:  11/25/20 245 lb (111.1 kg)  12/11/19 250 lb (113.4 kg)  08/18/18 225 lb 3.2 oz (102.2 kg)    Health Maintenance Due  Topic Date Due   COVID-19 Vaccine (1) Never done   Pneumococcal Vaccine 86-53 Years old (1 - PCV) Never done   Zoster Vaccines- Shingrix (1 of 2) Never done    There are no preventive care reminders to display for this patient.   Lab Results  Component Value Date   TSH 2.040 02/22/2018   Lab Results  Component Value Date   WBC 9.7 12/11/2019   HGB 15.1 12/11/2019   HCT 43.2 12/11/2019   MCV 91 12/11/2019   PLT 167 12/11/2019   Lab Results  Component Value Date   NA 143 12/11/2019   K 4.0 12/11/2019   CO2 21 12/11/2019   GLUCOSE 92 12/11/2019   BUN 19 12/11/2019   CREATININE 1.08 12/11/2019   BILITOT 0.5 12/11/2019   ALKPHOS 64 12/11/2019   AST 19 12/11/2019   ALT 28 12/11/2019   PROT 7.1 12/11/2019   ALBUMIN 4.3 12/11/2019   CALCIUM 8.6 (L) 12/11/2019   ANIONGAP 7 05/16/2017   Lab Results  Component Value Date   CHOL 178 12/11/2019   Lab Results  Component Value Date   HDL 39 (L) 12/11/2019   Lab Results  Component Value Date   LDLCALC 114 (H) 12/11/2019   Lab Results  Component Value Date   TRIG 141 12/11/2019   Lab Results  Component Value Date   CHOLHDL 4.6 12/11/2019   Lab Results  Component Value Date   HGBA1C 5.4% 06/24/2015       Assessment & Plan:   Problem List Items Addressed This Visit       Musculoskeletal and Integument   Pruritus - Primary    Patient is reporting symptoms of pruritus and rash.  No rash, erythematous skin observed/assessed on today's visit.  Ordered hydrocortisone cream for patient to apply for symptoms of skin irritation or pruritus.  Advised patient to follow-up if symptoms are unresolved.  Rx sent to  pharmacy.       Relevant Medications   hydrocortisone 1 % lotion     Meds ordered this  encounter  Medications   hydrocortisone 1 % lotion    Sig: Apply 1 application topically 2 (two) times daily.    Dispense:  118 mL    Refill:  0    Order Specific Question:   Supervising Provider    Answer:   Janora Norlander [[id number]]     Ivy Lynn, NP

## 2020-11-25 NOTE — Patient Instructions (Signed)
Pruritus Pruritus is an itchy feeling on the skin. One of the most common causes is dry skin, but many different things can cause itching. Most cases of itching do notrequire medical attention. Sometimes itchy skin can turn into a rash. Follow these instructions at home: Skin care  Apply moisturizing lotion to your skin as needed. Lotion that contains petroleum jelly is best. Take medicines or apply medicated creams only as told by your health care provider. This may include: Corticosteroid cream. Anti-itch lotions. Oral antihistamines. Apply a cool, wet cloth (cool compress) to the affected areas. Take baths with one of the following: Epsom salts. You can get these at your local pharmacy or grocery store. Follow the instructions on the packaging. Baking soda. Pour a small amount into the bath as told by your health care provider. Colloidal oatmeal. You can get this at your local pharmacy or grocery store. Follow the instructions on the packaging. Apply baking soda paste to your skin. To make the paste, stir water into a small amount of baking soda until it reaches a paste-like consistency. Do not scratch your skin. Do not take hot showers or baths, which can make itching worse. A cool shower may help with itching as long as you apply moisturizing lotion after the shower. Do not use scented soaps, detergents, perfumes, and cosmetic products. Instead, use gentle, unscented versions of these items.  General instructions Avoid wearing tight clothes. Keep a journal to help find out what is causing your itching. Write down: What you eat and drink. What cosmetic products you use. What soaps or detergents you use. What you wear, including jewelry. Use a humidifier. This keeps the air moist, which helps to prevent dry skin. Be aware of any changes in your itchiness. Contact a health care provider if: The itching does not go away after several days. You are unusually thirsty or urinating more  than normal. Your skin tingles or feels numb. Your skin or the white parts of your eyes turn yellow (jaundice). You feel weak. You have any of the following: Night sweats. Tiredness (fatigue). Weight loss. Abdominal pain. Summary Pruritus is an itchy feeling on the skin. One of the most common causes is dry skin, but many different conditions and factors can cause itching. Apply moisturizing lotion to your skin as needed. Lotion that contains petroleum jelly is best. Take medicines or apply medicated creams only as told by your health care provider. Do not take hot showers or baths. Do not use scented soaps, detergents, perfumes, or cosmetic products. This information is not intended to replace advice given to you by your health care provider. Make sure you discuss any questions you have with your healthcare provider. Document Revised: 06/18/2017 Document Reviewed: 06/18/2017 Elsevier Patient Education  2022 Elsevier Inc.  

## 2020-11-26 ENCOUNTER — Encounter: Payer: Self-pay | Admitting: Nurse Practitioner

## 2020-11-26 NOTE — Assessment & Plan Note (Signed)
Patient is reporting symptoms of pruritus and rash.  No rash, erythematous skin observed/assessed on today's visit.  Ordered hydrocortisone cream for patient to apply for symptoms of skin irritation or pruritus.  Advised patient to follow-up if symptoms are unresolved.  Rx sent to pharmacy.

## 2020-12-10 ENCOUNTER — Other Ambulatory Visit: Payer: Self-pay | Admitting: Family Medicine

## 2020-12-10 DIAGNOSIS — M542 Cervicalgia: Secondary | ICD-10-CM

## 2020-12-10 DIAGNOSIS — M545 Low back pain, unspecified: Secondary | ICD-10-CM

## 2021-03-03 ENCOUNTER — Ambulatory Visit (INDEPENDENT_AMBULATORY_CARE_PROVIDER_SITE_OTHER): Payer: Medicare Other

## 2021-03-03 VITALS — Ht 68.0 in | Wt 245.0 lb

## 2021-03-03 DIAGNOSIS — Z0000 Encounter for general adult medical examination without abnormal findings: Secondary | ICD-10-CM | POA: Diagnosis not present

## 2021-03-03 NOTE — Progress Notes (Signed)
Subjective:   Roberto Hanson is a 55 y.o. male who presents for Medicare Annual/Subsequent preventive examination.  Virtual Visit via Telephone Note  I connected with  [name] on 03/03/21 at  9:00 AM EDT by telephone and verified that I am speaking with the correct person using two identifiers.  Location: Patient: Home Provider: [facility] Persons participating in the virtual visit: patient/Nurse Health Advisor   I discussed the limitations, risks, security and privacy concerns of performing an evaluation and management service by telephone and the availability of in person appointments. The patient expressed understanding and agreed to proceed.  Interactive audio and video telecommunications were attempted between this nurse and patient, however failed, due to patient having technical difficulties OR patient did not have access to video capability.  We continued and completed visit with audio only.  Some vital signs may be absent or patient reported.   Lasheena Frieze E Zacherie Honeyman, LPN   Review of Systems     Cardiac Risk Factors include: advanced age (>77mn, >>34women);male gender;sedentary lifestyle;obesity (BMI >30kg/m2);dyslipidemia     Objective:    Today's Vitals   03/03/21 0932  Weight: 245 lb (111.1 kg)  Height: '5\' 8"'$  (1.727 m)  PainSc: 4    Body mass index is 37.25 kg/m.  Advanced Directives 03/03/2021 10/23/2019 05/16/2017 04/23/2017 11/16/2016  Does Patient Have a Medical Advance Directive? No No No No No  Would patient like information on creating a medical advance directive? No - Patient declined No - Patient declined No - Patient declined - Yes (MAU/Ambulatory/Procedural Areas - Information given)    Current Medications (verified) Outpatient Encounter Medications as of 03/03/2021  Medication Sig   hydrocortisone 1 % lotion Apply 1 application topically 2 (two) times daily.   naproxen (NAPROSYN) 500 MG tablet Take 1 tablet (500 mg total) by mouth 2 (two) times  daily with a meal.   No facility-administered encounter medications on file as of 03/03/2021.    Allergies (verified) Patient has no known allergies.   History: Past Medical History:  Diagnosis Date   Anxiety    Chronic back pain    Chronic neck pain    Eye disorder    OSA (obstructive sleep apnea)    cpap   Past Surgical History:  Procedure Laterality Date   COLONOSCOPY WITH PROPOFOL N/A 05/22/2017   Procedure: COLONOSCOPY WITH PROPOFOL;  Surgeon: RDaneil Dolin MD;  [facility];  Service: Endoscopy;  Laterality: N/A;  12:00pm   EYE SURGERY     POLYPECTOMY  05/22/2017   Procedure: POLYPECTOMY;  Surgeon: RDaneil Dolin MD;  [facility];  Service: Endoscopy;;  colon   Family History  Problem Relation Age of Onset   Diabetes Mother    Diabetes Father    Diabetes Brother    Glaucoma Sister    Healthy Daughter    Glaucoma Brother    Diabetes Brother    Glaucoma Sister    Colon cancer Neg Hx    Social History   Socioeconomic History   Marital status: Significant Other    Spouse name: Not on file   Number of children: 1   Years of education: Not on file   Highest education level: 11th grade  Occupational History   Occupation: employed  Tobacco Use   Smoking status: Former    Packs/day: 0.25    Years: 34.00    Pack years: 8.50    Types: Cigarettes    Quit date: 04/19/2015  Years since quitting: 5.8   Smokeless tobacco: Never  Vaping Use   Vaping Use: Never used  Substance and Sexual Activity   Alcohol use: No    Comment: rare   Drug use: No   Sexual activity: Not Currently  Other Topics Concern   Not on file  Social History Narrative   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   Girlfriend assists with transportation, cooking, etc   Social Determinants of Health   Financial Resource Strain: Low Risk    Difficulty of Paying Living Expenses: Not hard at all  Food Insecurity: No Food Insecurity   Worried About Charity fundraiser in the  Last Year: Never true   [place] in the Last Year: Never true  Transportation Needs: No Transportation Needs   Lack of Transportation (Medical): No   Lack of Transportation (Non-Medical): No  Physical Activity: Insufficiently Active   Days of Exercise per Week: 7 days   Minutes of Exercise per Session: 10 min  Stress: No Stress Concern Present   Feeling of Stress : Only a little  Social Connections: Moderately Integrated   Frequency of Communication with Friends and Family: More than three times a week   Frequency of Social Gatherings with Friends and Family: More than three times a week   Attends Religious Services: More than 4 times per year   Active Member of [employer] or Organizations: Yes   Attends Music therapist: More than 4 times per year   Marital Status: Separated    Tobacco Counseling Counseling given: Not Answered   Clinical Intake:  Pre-visit preparation completed: Yes  Pain : 0-10 Pain Score: 4  Pain Type: Chronic pain Pain Location: Back Pain Orientation: Lower Pain Descriptors / Indicators: Aching, Discomfort Pain Onset: More than a month ago Pain Frequency: Intermittent     BMI - recorded: 37.25 Nutritional Status: BMI > 30  Obese Nutritional Risks: None Diabetes: No  How often do you need to have someone help you when you read instructions, pamphlets, or other written materials from your doctor or pharmacy?: 1 - Never  Diabetic? No  Interpreter Needed?: No  Information entered by :: Charnette Younkin, LPN   Activities of Daily Living In your present state of health, do you have any difficulty performing the following activities: 03/03/2021  Hearing? N  Vision? Y  Comment blind  Difficulty concentrating or making decisions? N  Walking or climbing stairs? N  Dressing or bathing? N  Doing errands, shopping? Y  Preparing Food and eating ? Y  Using the Toilet? N  In the past six months, have you accidently leaked urine? N  Do you  have problems with loss of bowel control? N  Managing your Medications? Y  Managing your Finances? Y  Housekeeping or managing your Housekeeping? Y  Some recent data might be hidden    Patient Care Team: Loman Brooklyn, FNP as PCP - General (Family Medicine) Gala Romney Cristopher Estimable, MD as Consulting Physician (Gastroenterology)  Indicate any recent Medical Services you may have received from other than [facility] providers in the past year (date may be approximate).     Assessment:   This is a routine wellness examination for Leary.  Hearing/Vision screen Hearing Screening - Comments:: Denies hearing difficulties  Vision Screening - Comments:: Blind - up to date with annual eye exams with [place] issues and exercise activities discussed: Current Exercise Habits: Home exercise routine, Type of exercise: walking, Time (Minutes): 10,  Frequency (Times/Week): 7, Weekly Exercise (Minutes/Week): 70, Exercise limited by: orthopedic condition(s)   Goals Addressed             This Visit's Progress    Exercise 3x per week (30 min per time)   Not on track    Continue to exercise for at least 30 minutes daily.       Depression Screen PHQ 2/9 Scores 03/03/2021 12/11/2019 10/23/2019 10/17/2018 08/18/2018 05/09/2018 02/13/2018  PHQ - 2 Score 0 0 0 0 0 0 0  PHQ- 9 Score - 0 - - - - -    Fall Risk Fall Risk  03/03/2021 12/11/2019 10/23/2019 10/17/2018 04/03/2017  Falls in the past year? 0 0 0 0 No  Number falls in past yr: 0 - - - -  Injury with Fall? 0 - - - -  Risk for fall due to : Impaired vision;Orthopedic patient - - - -  Follow up Falls prevention discussed - - - -    FALL RISK PREVENTION PERTAINING TO THE HOME:  Any stairs in or around the home? No  If so, are there any without handrails? No  Home free of loose throw rugs in walkways, pet beds, electrical cords, etc? Yes  Adequate lighting in your home to reduce risk of falls? Yes   ASSISTIVE DEVICES UTILIZED TO PREVENT  FALLS:  Life alert? No  Use of a cane, walker or w/c? No  Grab bars in the bathroom? No  Shower chair or bench in shower? No  Elevated toilet seat or a handicapped toilet? No   TIMED UP AND GO:  Was the test performed? No . Telephonic visit  Cognitive Function: Normal cognitive status assessed by direct observation by this Nurse Health Advisor. No abnormalities found.    MMSE - Mini Mental State Exam 11/16/2016  Orientation to time 5  Orientation to Place 5  Registration 3  Attention/ Calculation 5  Recall 3  Language- name 2 objects 2  Language- repeat 1  Language- follow 3 step command 3  Language- read & follow direction 1  Write a sentence (No Data)  Write a sentence-comments unable to complete due to visual limitations  Copy design (No Data)  Copy design-comments unable to complete due to visual limitations     6CIT Screen 10/23/2019 10/17/2018  What Year? 0 points 0 points  What month? 0 points 0 points  What time? 0 points 0 points  Count back from 20 0 points 0 points  Months in reverse 0 points 0 points  Repeat phrase 0 points 0 points  Total Score 0 0    Immunizations Immunization History  Administered Date(s) Administered   Tdap 11/16/2016    TDAP status: Up to date  Flu Vaccine status: Declined, Education has been provided regarding the importance of this vaccine but patient still declined. Advised may receive this vaccine at local pharmacy or Health Dept. Aware to provide a copy of the vaccination record if obtained from local pharmacy or Health Dept. Verbalized acceptance and understanding.  Pneumococcal vaccine status: Declined,  Education has been provided regarding the importance of this vaccine but patient still declined. Advised may receive this vaccine at local pharmacy or Health Dept. Aware to provide a copy of the vaccination record if obtained from local pharmacy or Health Dept. Verbalized acceptance and understanding.   Covid-19 vaccine status:  Declined, Education has been provided regarding the importance of this vaccine but patient still declined. Advised may receive this vaccine at local pharmacy  or Health Dept.or vaccine clinic. Aware to provide a copy of the vaccination record if obtained from local pharmacy or Health Dept. Verbalized acceptance and understanding.  Qualifies for Shingles Vaccine? Yes   Zostavax completed No   Shingrix Completed?: No.    Education has been provided regarding the importance of this vaccine. Patient has been advised to call insurance company to determine out of pocket expense if they have not yet received this vaccine. Advised may also receive vaccine at local pharmacy or Health Dept. Verbalized acceptance and understanding.  Screening Tests Health Maintenance  Topic Date Due   COVID-19 Vaccine (1) Never done   Pneumococcal Vaccine 77-20 Years old (1 - PCV) Never done   HIV Screening  Never done   Hepatitis C Screening  Never done   Zoster Vaccines- Shingrix (1 of 2) Never done   TETANUS/TDAP  11/17/2026   COLONOSCOPY (Pts 45-41yr Insurance coverage will need to be confirmed)  05/23/2027   HPV VACCINES  Aged Out   INFLUENZA VACCINE  Discontinued    Health Maintenance  Health Maintenance Due  Topic Date Due   COVID-19 Vaccine (1) Never done   Pneumococcal Vaccine 047675Years old (1 - PCV) Never done   HIV Screening  Never done   Hepatitis C Screening  Never done   Zoster Vaccines- Shingrix (1 of 2) Never done    Colorectal cancer screening: Type of screening: Colonoscopy. Completed 05/22/2017. Repeat every 10 years  Lung Cancer Screening: (Low Dose CT Chest recommended if Age 55-80years, 30 pack-year currently smoking OR have quit w/in 15years.) does not qualify.   Additional Screening:  Hepatitis C Screening: does not qualify  Vision Screening: Recommended annual ophthalmology exams for early detection of glaucoma and other disorders of the eye. Is the patient up to date with their  annual eye exam?  Yes  Who is the provider or what is the name of the office in which the patient attends annual eye exams? [email or web address] is not established with a provider, would they like to be referred to a provider to establish care? No .   Dental Screening: Recommended annual dental exams for proper oral hygiene  Community Resource Referral / Chronic Care Management: CRR required this visit?  No   CCM required this visit?  No      Plan:     I have personally reviewed and noted the following in the patient's chart:   Medical and social history Use of alcohol, tobacco or illicit drugs  Current medications and supplements including opioid prescriptions. Patient is not currently taking opioid prescriptions. Functional ability and status Nutritional status Physical activity Advanced directives List of other physicians Hospitalizations, surgeries, and ER visits in previous 12 months Vitals Screenings to include cognitive, depression, and falls Referrals and appointments  In addition, I have reviewed and discussed with patient certain preventive protocols, quality metrics, and best practice recommendations. A written personalized care plan for preventive services as well as general preventive health recommendations were provided to patient.     ASandrea Hammond LPN   [id number]  Nurse Notes: None

## 2021-03-03 NOTE — Patient Instructions (Signed)
[name] , Thank you for taking time to come for your Medicare Wellness Visit. I appreciate your ongoing commitment to your health goals. Please review the following plan we discussed and let me know if I can assist you in the future.   Screening recommendations/referrals: Colonoscopy: Done 05/22/2017 - Repeat in 10 years Recommended yearly ophthalmology/optometry visit for glaucoma screening and checkup Recommended yearly dental visit for hygiene and checkup  Vaccinations: Influenza vaccine: Due. Every fall Pneumococcal vaccine: Due. 2 doses one year apart Tdap vaccine: Done 11/16/2016 - Repeat in 10 years Shingles vaccine: Due. Shingrix discussed. Please contact your pharmacy for coverage information.     Covid-19: Declined  Advanced directives: Advance directive discussed with you today. Even though you declined this today, please call our office should you change your mind, and we can give you the proper paperwork for you to fill out.   Conditions/risks identified: Aim for 30 minutes of exercise or brisk walking each day, drink 6-8 glasses of water and eat lots of fruits and vegetables.   Next appointment: Follow up in one year for your annual wellness visit   Preventive Care 40-64 Years, Male Preventive care refers to lifestyle choices and visits with your health care provider that can promote health and wellness. What does preventive care include? A yearly physical exam. This is also called an annual well check. Dental exams once or twice a year. Routine eye exams. Ask your health care provider how often you should have your eyes checked. Personal lifestyle choices, including: Daily care of your teeth and gums. Regular physical activity. Eating a healthy diet. Avoiding tobacco and drug use. Limiting alcohol use. Practicing safe sex. Taking low-dose aspirin every day starting at age 22. What happens during an annual well check? The services and screenings done by your health  care provider during your annual well check will depend on your age, overall health, lifestyle risk factors, and family history of disease. Counseling  Your health care provider may ask you questions about your: Alcohol use. Tobacco use. Drug use. Emotional well-being. Home and relationship well-being. Sexual activity. Eating habits. Work and work Statistician. Screening  You may have the following tests or measurements: Height, weight, and BMI. Blood pressure. Lipid and cholesterol levels. These may be checked every 5 years, or more frequently if you are over 31 years old. Skin check. Lung cancer screening. You may have this screening every year starting at age 67 if you have a 30-pack-year history of smoking and currently smoke or have quit within the past 15 years. Fecal occult blood test (FOBT) of the stool. You may have this test every year starting at age 66. Flexible sigmoidoscopy or colonoscopy. You may have a sigmoidoscopy every 5 years or a colonoscopy every 10 years starting at age 68. Prostate cancer screening. Recommendations will vary depending on your family history and other risks. Hepatitis C blood test. Hepatitis B blood test. Sexually transmitted disease (STD) testing. Diabetes screening. This is done by checking your blood sugar (glucose) after you have not eaten for a while (fasting). You may have this done every 1-3 years. Discuss your test results, treatment options, and if necessary, the need for more tests with your health care provider. Vaccines  Your health care provider may recommend certain vaccines, such as: Influenza vaccine. This is recommended every year. Tetanus, diphtheria, and acellular pertussis (Tdap, Td) vaccine. You may need a Td booster every 10 years. Zoster vaccine. You may need this after age 39. Pneumococcal 13-valent  conjugate (PCV13) vaccine. You may need this if you have certain conditions and have not been vaccinated. Pneumococcal  polysaccharide (PPSV23) vaccine. You may need one or two doses if you smoke cigarettes or if you have certain conditions. Talk to your health care provider about which screenings and vaccines you need and how often you need them. This information is not intended to replace advice given to you by your health care provider. Make sure you discuss any questions you have with your health care provider. Document Released: 07/01/2015 Document Revised: 02/22/2016 Document Reviewed: 04/05/2015 Elsevier Interactive Patient Education  2017 [place] Prevention in the Home Falls can cause injuries. They can happen to people of all ages. There are many things you can do to make your home safe and to help prevent falls. What can I do on the outside of my home? Regularly fix the edges of walkways and driveways and fix any cracks. Remove anything that might make you trip as you walk through a door, such as a raised step or threshold. Trim any bushes or trees on the path to your home. Use bright outdoor lighting. Clear any walking paths of anything that might make someone trip, such as rocks or tools. Regularly check to see if handrails are loose or broken. Make sure that both sides of any steps have handrails. Any raised decks and porches should have guardrails on the edges. Have any leaves, snow, or ice cleared regularly. Use sand or salt on walking paths during winter. Clean up any spills in your garage right away. This includes oil or grease spills. What can I do in the bathroom? Use night lights. Install grab bars by the toilet and in the tub and shower. Do not use towel bars as grab bars. Use non-skid mats or decals in the tub or shower. If you need to sit down in the shower, use a plastic, non-slip stool. Keep the floor dry. Clean up any water that spills on the floor as soon as it happens. Remove soap buildup in the tub or shower regularly. Attach bath mats securely with double-sided  non-slip rug tape. Do not have throw rugs and other things on the floor that can make you trip. What can I do in the bedroom? Use night lights. Make sure that you have a light by your bed that is easy to reach. Do not use any sheets or blankets that are too big for your bed. They should not hang down onto the floor. Have a firm chair that has side arms. You can use this for support while you get dressed. Do not have throw rugs and other things on the floor that can make you trip. What can I do in the kitchen? Clean up any spills right away. Avoid walking on wet floors. Keep items that you use a lot in easy-to-reach places. If you need to reach something above you, use a strong step stool that has a grab bar. Keep electrical cords out of the way. Do not use floor polish or wax that makes floors slippery. If you must use wax, use non-skid floor wax. Do not have throw rugs and other things on the floor that can make you trip. What can I do with my stairs? Do not leave any items on the stairs. Make sure that there are handrails on both sides of the stairs and use them. Fix handrails that are broken or loose. Make sure that handrails are as long as the stairways. Check  any carpeting to make sure that it is firmly attached to the stairs. Fix any carpet that is loose or worn. Avoid having throw rugs at the top or bottom of the stairs. If you do have throw rugs, attach them to the floor with carpet tape. Make sure that you have a light switch at the top of the stairs and the bottom of the stairs. If you do not have them, ask someone to add them for you. What else can I do to help prevent falls? Wear shoes that: Do not have high heels. Have rubber bottoms. Are comfortable and fit you well. Are closed at the toe. Do not wear sandals. If you use a stepladder: Make sure that it is fully opened. Do not climb a closed stepladder. Make sure that both sides of the stepladder are locked into place. Ask  someone to hold it for you, if possible. Clearly mark and make sure that you can see: Any grab bars or handrails. First and last steps. Where the edge of each step is. Use tools that help you move around (mobility aids) if they are needed. These include: Canes. Walkers. Scooters. Crutches. Turn on the lights when you go into a dark area. Replace any light bulbs as soon as they burn out. Set up your furniture so you have a clear path. Avoid moving your furniture around. If any of your floors are uneven, fix them. If there are any pets around you, be aware of where they are. Review your medicines with your doctor. Some medicines can make you feel dizzy. This can increase your chance of falling. Ask your doctor what other things that you can do to help prevent falls. This information is not intended to replace advice given to you by your health care provider. Make sure you discuss any questions you have with your health care provider. Document Released: 03/31/2009 Document Revised: 11/10/2015 Document Reviewed: 07/09/2014 Elsevier Interactive Patient Education  2017 [employer].

## 2021-03-09 ENCOUNTER — Other Ambulatory Visit: Payer: Self-pay

## 2021-03-09 ENCOUNTER — Encounter: Payer: Self-pay | Admitting: Family Medicine

## 2021-03-09 ENCOUNTER — Ambulatory Visit (INDEPENDENT_AMBULATORY_CARE_PROVIDER_SITE_OTHER): Payer: Medicare Other | Admitting: Family Medicine

## 2021-03-09 VITALS — BP 118/59 | HR 72 | Temp 98.1°F | Ht 68.0 in | Wt 247.6 lb

## 2021-03-09 DIAGNOSIS — L299 Pruritus, unspecified: Secondary | ICD-10-CM | POA: Diagnosis not present

## 2021-03-09 DIAGNOSIS — G4733 Obstructive sleep apnea (adult) (pediatric): Secondary | ICD-10-CM | POA: Diagnosis not present

## 2021-03-09 DIAGNOSIS — Z9989 Dependence on other enabling machines and devices: Secondary | ICD-10-CM

## 2021-03-09 NOTE — Patient Instructions (Signed)
Zyrtec or Xyzal for itching/rash

## 2021-03-09 NOTE — Progress Notes (Signed)
Assessment & Plan:  1. Pruritus - encouraged continued use of hydrocortisone cream - encouraged use of an antihistamine  - education provided on contact dermatitis - wash all clothes before wearing  2. Obstructive sleep apnea treated with continuous positive airway pressure (CPAP) - ambulatory referral to pulmonology for new sleep study   Follow up plan: Return in about 3 months (around 06/08/2021) for annual physical.  Lucile Crater, NP Student  I personally was present during the history, physical exam, and medical decision-making activities of this service and have verified that the service and findings are accurately documented in the nurse practitioner student's note.  Hendricks Limes, MSN, APRN, FNP-C [facility]   Subjective:   Patient ID: Roberto Hanson, male    DOB: [birth year], 55 y.o.   MRN: [id number]  HPI: Roberto Hanson is a 55 y.o. male presenting on 03/09/2021 for Rash (Bilateral arms and neck that comes and goes. Patient states it has been on and off x 1 month. Last visit was 6/10 ) and Obstructive Sleep Apnea (Patient needs new Cpap machine )  He was seen in June for a rash and was treated with hydrocortisone cream. He states that the rash spots come and go and have itching associated with them. He states that they randomly occur on his head, back, and arms. He states they do not ooze or cause pain, only itching. He has not tried any antihistamines. He thought that they might be bug bites but he has never seen or felt any bugs bite him.   He was previously diagnosed with OSA several years ago at a sleep clinic and was prescribed a CPAP machine. He states that he needs a new machine and needs a prescription faxed to the company. His last sleep study was in 2008.   ROS: Negative unless specifically indicated above in HPI.   Relevant past medical history reviewed and updated as indicated.   Allergies and medications reviewed and  updated.   Current Outpatient Medications:    hydrocortisone 1 % lotion, Apply 1 application topically 2 (two) times daily., Disp: 118 mL, Rfl: 0   naproxen (NAPROSYN) 500 MG tablet, Take 1 tablet (500 mg total) by mouth 2 (two) times daily with a meal., Disp: 180 tablet, Rfl: 3  No Known Allergies  Objective:   BP (!) 118/59   Pulse 72   Temp 98.1 F (36.7 C) (Temporal)   Ht 5\' 8"  (1.727 m)   Wt 112.3 kg   BMI 37.65 kg/m    Physical Exam Vitals reviewed.  Constitutional:      General: He is not in acute distress.    Appearance: Normal appearance. He is obese. He is not ill-appearing, toxic-appearing or diaphoretic.  HENT:     Head: Normocephalic and atraumatic.  Eyes:     General: No scleral icterus.       Right eye: No discharge.        Left eye: No discharge.     Conjunctiva/sclera: Conjunctivae normal.  Cardiovascular:     Rate and Rhythm: Normal rate and regular rhythm.     Heart sounds: Normal heart sounds. No murmur heard.   No friction rub. No gallop.  Pulmonary:     Effort: Pulmonary effort is normal. No respiratory distress.     Breath sounds: Normal breath sounds. No stridor. No wheezing, rhonchi or rales.  Musculoskeletal:        General: Normal range of motion.     Cervical  back: Normal range of motion.     Right lower leg: No edema.     Left lower leg: No edema.  Skin:    General: Skin is warm and dry.     Comments: Scattered scaled lesions on back of head, back, and arms. Several very small skin tags.  Neurological:     Mental Status: He is alert and oriented to person, place, and time. Mental status is at baseline.  Psychiatric:        Mood and Affect: Mood normal.        Behavior: Behavior normal.        Thought Content: Thought content normal.        Judgment: Judgment normal.

## 2021-03-10 ENCOUNTER — Other Ambulatory Visit: Payer: Self-pay | Admitting: Family Medicine

## 2021-03-10 DIAGNOSIS — G4733 Obstructive sleep apnea (adult) (pediatric): Secondary | ICD-10-CM

## 2021-04-14 ENCOUNTER — Other Ambulatory Visit: Payer: Self-pay

## 2021-04-14 ENCOUNTER — Ambulatory Visit (INDEPENDENT_AMBULATORY_CARE_PROVIDER_SITE_OTHER): Payer: Medicare Other | Admitting: Pulmonary Disease

## 2021-04-14 ENCOUNTER — Encounter: Payer: Self-pay | Admitting: Pulmonary Disease

## 2021-04-14 VITALS — BP 132/56 | HR 73 | Temp 98.6°F | Ht 68.0 in | Wt 242.6 lb

## 2021-04-14 DIAGNOSIS — Z9989 Dependence on other enabling machines and devices: Secondary | ICD-10-CM

## 2021-04-14 DIAGNOSIS — G4733 Obstructive sleep apnea (adult) (pediatric): Secondary | ICD-10-CM | POA: Diagnosis not present

## 2021-04-14 NOTE — Progress Notes (Signed)
Roberto Hanson    [id number]    June 17, 1966  Primary Care Physician:Joyce, Shireen Quan, FNP  Referring Physician: Gwenlyn Perking, [name],  [name]  Chief complaint:   Patient with a history of obstructive sleep apnea  HPI:  History of obstructive sleep apnea with a dated machine  DME company is [facility]  Has been on CPAP for since 2007 Wakes up feeling like is at a good nights rest  Diagnosed at [facility] lab Was on CPAP of 13  History of anxiety, chronic back pain, sleep apnea for which he is compliant with CPAP  Wakes up feeling rested   Outpatient Encounter Medications as of 04/14/2021  Medication Sig   hydrocortisone 1 % lotion Apply 1 application topically 2 (two) times daily.   naproxen (NAPROSYN) 500 MG tablet Take 1 tablet (500 mg total) by mouth 2 (two) times daily with a meal.   No facility-administered encounter medications on file as of 04/14/2021.    Allergies as of 04/14/2021   (No Known Allergies)    Past Medical History:  Diagnosis Date   Anxiety    Chronic back pain    Chronic neck pain    Eye disorder    OSA (obstructive sleep apnea)    cpap    Past Surgical History:  Procedure Laterality Date   COLONOSCOPY WITH PROPOFOL N/A 05/22/2017   Procedure: COLONOSCOPY WITH PROPOFOL;  Surgeon: [name], MD;  Location: [facility];  Service: Endoscopy;  Laterality: N/A;  12:00pm   EYE SURGERY     POLYPECTOMY  05/22/2017   Procedure: POLYPECTOMY;  Surgeon: [name], MD;  Location: [facility];  Service: Endoscopy;;  colon    Family History  Problem Relation Age of Onset   Diabetes Mother    Diabetes Father    Diabetes Brother    Glaucoma Sister    Healthy Daughter    Glaucoma Brother    Diabetes Brother    Glaucoma Sister    Colon cancer Neg Hx     Social History   Socioeconomic History   Marital status: Significant Other    Spouse name: Not on file    Number of children: 1   Years of education: Not on file   Highest education level: 11th grade  Occupational History   Occupation: employed  Tobacco Use   Smoking status: Former    Packs/day: 0.25    Years: 34.00    Pack years: 8.50    Types: Cigarettes    Quit date: 04/19/2015    Years since quitting: 5.9   Smokeless tobacco: Never  Vaping Use   Vaping Use: Never used  Substance and Sexual Activity   Alcohol use: No    Comment: rare   Drug use: No   Sexual activity: Not Currently  Other Topics Concern   Not on file  Social History Narrative   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   Girlfriend assists with transportation, cooking, etc   Social Determinants of Health   Financial Resource Strain: Low Risk    Difficulty of Paying Living Expenses: Not hard at all  Food Insecurity: No Food Insecurity   Worried About Charity fundraiser in the Last Year: Never true   [place] in the Last Year: Never true  Transportation Needs: No Transportation Needs   Lack of Transportation (Medical): No   Lack of Transportation (  Non-Medical): No  Physical Activity: Insufficiently Active   Days of Exercise per Week: 7 days   Minutes of Exercise per Session: 10 min  Stress: No Stress Concern Present   Feeling of Stress : Only a little  Social Connections: Moderately Integrated   Frequency of Communication with Friends and Family: More than three times a week   Frequency of Social Gatherings with Friends and Family: More than three times a week   Attends Religious Services: More than 4 times per year   Active Member of [employer] or Organizations: Yes   Attends Music therapist: More than 4 times per year   Marital Status: Separated  Intimate Partner Violence: Not At Risk   Fear of Current or Ex-Partner: No   Emotionally Abused: No   Physically Abused: No   Sexually Abused: No    Review of Systems  Constitutional:  Negative for fatigue.  Respiratory:  Positive for apnea.    Psychiatric/Behavioral:  Positive for sleep disturbance.    Vitals:   04/14/21 1430  BP: (!) 132/56  Pulse: 73  Temp: 98.6 F (37 C)  SpO2: 97%     Physical Exam Constitutional:      Appearance: He is obese.  HENT:     Head: Normocephalic.     Mouth/Throat:     Mouth: Mucous membranes are moist.     Comments: Mallampati 4, crowded oropharynx Eyes:     Pupils: Pupils are equal, round, and reactive to light.  Cardiovascular:     Rate and Rhythm: Normal rate and regular rhythm.     Heart sounds: No murmur heard.   No friction rub.  Pulmonary:     Effort: No respiratory distress.     Breath sounds: No stridor. No wheezing or rhonchi.  Musculoskeletal:     Cervical back: No rigidity or tenderness.  Neurological:     Mental Status: He is alert.  Psychiatric:        Mood and Affect: Mood normal.   Results of the Epworth flowsheet 04/14/2021  Sitting and reading 2  Watching TV 3  Sitting, inactive in a public place (e.g. a theatre or a meeting) 2  As a passenger in a car for an hour without a break 2  Lying down to rest in the afternoon when circumstances permit 3  Sitting and talking to someone 2  Sitting quietly after a lunch without alcohol 2  In a car, while stopped for a few minutes in traffic 0  Total score 16    Data Reviewed: Previous study not available  Assessment:  History of obstructive sleep apnea  Adequately treated with CPAP therapy with patient continuing to benefit from CPAP therapy  Requires a new machine  Plan/Recommendations: Contact [facility]  CPAP, auto titrating CPAP 5-20, patient's mask of choice  Routine follow-up in about 3 months  Encouraged to call with any significant concerns   Sherrilyn Rist MD [facility] 04/14/2021, 3:04 PM  CC: Gwenlyn Perking, FNP

## 2021-04-14 NOTE — Patient Instructions (Addendum)
We will contact [facility] to see if you need a new study done  Schedule you for sleep study if needed  If not we will send him a prescription for auto titrating CPAP settings of 5-20  We will see you back in about 3 months  Call with significant concerns

## 2021-04-24 ENCOUNTER — Telehealth: Payer: Self-pay | Admitting: Pulmonary Disease

## 2021-04-24 DIAGNOSIS — G4733 Obstructive sleep apnea (adult) (pediatric): Secondary | ICD-10-CM

## 2021-04-25 NOTE — Telephone Encounter (Signed)
AO the pt is calling back to see if he will need to have a new sleep study done or if we are able to go ahead and order the new cpap.  Please advise. Thanks

## 2021-04-25 NOTE — Telephone Encounter (Signed)
Tyrone at [phone]. Was on hold for over 10 minutes. Will try again tomorrow.

## 2021-04-25 NOTE — Telephone Encounter (Signed)
We need to contact his medical supply company to find out whether he needs a study or not-it varies by patient's coverage which the DME company will now  If he does not need a study then a CPAP prescription,  auto CPAP 5-20 will be appropriate to send to his DME company

## 2021-04-28 NOTE — Telephone Encounter (Signed)
I called and spoke with [facility]  Confirmed that the pt does not need another sleep study  I placed order for a new machine  Pt is aware  Nothing further needed

## 2021-04-28 NOTE — Telephone Encounter (Signed)
Patient checking on CPAP machine order. Patient phone number is [phone].

## 2021-05-10 ENCOUNTER — Ambulatory Visit: Payer: Medicare Other | Admitting: Family Medicine

## 2021-05-10 DIAGNOSIS — H00015 Hordeolum externum left lower eyelid: Secondary | ICD-10-CM | POA: Diagnosis not present

## 2021-05-19 ENCOUNTER — Other Ambulatory Visit: Payer: Self-pay | Admitting: Nurse Practitioner

## 2021-05-19 ENCOUNTER — Other Ambulatory Visit: Payer: Self-pay | Admitting: Family Medicine

## 2021-05-19 DIAGNOSIS — M545 Low back pain, unspecified: Secondary | ICD-10-CM

## 2021-05-19 DIAGNOSIS — L299 Pruritus, unspecified: Secondary | ICD-10-CM

## 2021-05-19 DIAGNOSIS — G8929 Other chronic pain: Secondary | ICD-10-CM

## 2021-06-09 ENCOUNTER — Ambulatory Visit (INDEPENDENT_AMBULATORY_CARE_PROVIDER_SITE_OTHER): Payer: Medicare Other | Admitting: Family Medicine

## 2021-06-09 ENCOUNTER — Encounter: Payer: Self-pay | Admitting: Family Medicine

## 2021-06-09 VITALS — BP 113/78 | HR 65 | Temp 98.3°F | Ht 68.0 in | Wt 239.4 lb

## 2021-06-09 DIAGNOSIS — H029 Unspecified disorder of eyelid: Secondary | ICD-10-CM | POA: Diagnosis not present

## 2021-06-09 NOTE — Progress Notes (Signed)
Subjective:  Patient ID: Roberto Hanson, male    DOB: 1965-10-09, 55 y.o.   MRN: [id number]  Patient Care Team: Loman Brooklyn, FNP as PCP - General (Family Medicine) Gala Romney Cristopher Estimable, MD as Consulting Physician (Gastroenterology)   Chief Complaint:  Ellie Lunch   HPI: Roberto Hanson is a 55 y.o. male presenting on 06/09/2021 for Stye   Pt presents today for evaluation of lesion on right lower eyelid. States he was seen by UC because lesion was much larger and inflamed. He was placed on doxycycline. Inflammation has resolved and lesion has decreased in size but is still present. No visual disturbances from lesion.    Relevant past medical, surgical, family, and social history reviewed and updated as indicated.  Allergies and medications reviewed and updated. Data reviewed: Chart in Epic.   Past Medical History:  Diagnosis Date   Anxiety    Chronic back pain    Chronic neck pain    Eye disorder    OSA (obstructive sleep apnea)    cpap    Past Surgical History:  Procedure Laterality Date   COLONOSCOPY WITH PROPOFOL N/A 05/22/2017   Procedure: COLONOSCOPY WITH PROPOFOL;  Surgeon: [name], MD;  [facility];  Service: Endoscopy;  Laterality: N/A;  12:00pm   EYE SURGERY     POLYPECTOMY  05/22/2017   Procedure: POLYPECTOMY;  Surgeon: [name], MD;  Location: [facility];  Service: Endoscopy;;  colon    Social History   Socioeconomic History   Marital status: Significant Other    Spouse name: Not on file   Number of children: 1   Years of education: Not on file   Highest education level: 11th grade  Occupational History   Occupation: employed  Tobacco Use   Smoking status: Former    Packs/day: 0.25    Years: 34.00    Pack years: 8.50    Types: Cigarettes    Quit date: 04/19/2015    Years since quitting: 6.1   Smokeless tobacco: Never  Vaping Use   Vaping Use: Never used  Substance and Sexual Activity   Alcohol use: No     Comment: rare   Drug use: No   Sexual activity: Not Currently  Other Topics Concern   Not on file  Social History Narrative   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   Girlfriend assists with transportation, cooking, etc   Social Determinants of Health   Financial Resource Strain: Low Risk    Difficulty of Paying Living Expenses: Not hard at all  Food Insecurity: No Food Insecurity   Worried About Charity fundraiser in the Last Year: Never true   [place] in the Last Year: Never true  Transportation Needs: No Transportation Needs   Lack of Transportation (Medical): No   Lack of Transportation (Non-Medical): No  Physical Activity: Insufficiently Active   Days of Exercise per Week: 7 days   Minutes of Exercise per Session: 10 min  Stress: No Stress Concern Present   Feeling of Stress : Only a little  Social Connections: Moderately Integrated   Frequency of Communication with Friends and Family: More than three times a week   Frequency of Social Gatherings with Friends and Family: More than three times a week   Attends Religious Services: More than 4 times per year   Active Member of [employer] or Organizations: Yes   Attends Archivist Meetings: More than 4 times per  year   Marital Status: Separated  Intimate Partner Violence: Not At Risk   Fear of Current or Ex-Partner: No   Emotionally Abused: No   Physically Abused: No   Sexually Abused: No    Outpatient Encounter Medications as of 06/09/2021  Medication Sig   [employer] CORTISONE MAXIMUM STRENGTH 1 % lotion APPLY TO AFFECTED AREA TWICE A DAY   doxycycline (VIBRAMYCIN) 100 MG capsule Take 100 mg by mouth 2 (two) times daily.   naproxen (NAPROSYN) 500 MG tablet TAKE 1 TABLET (500 MG TOTAL) BY MOUTH 2 (TWO) TIMES DAILY WITH A MEAL.   No facility-administered encounter medications on file as of 06/09/2021.    No Known Allergies  Review of Systems  Constitutional:  Negative for activity change, appetite change, chills,  diaphoresis, fatigue, fever and unexpected weight change.  Eyes:  Positive for redness.       Lesion to eyelid  Neurological:  Negative for weakness and headaches.  Psychiatric/Behavioral:  Negative for confusion.   All other systems reviewed and are negative.      Objective:  BP 113/78    Pulse 65    Temp 98.3 F (36.8 C)    Ht 5' 8"  (1.727 m)    Wt 239 lb 6.4 oz (108.6 kg)    SpO2 96%    BMI 36.40 kg/m    Wt Readings from Last 3 Encounters:  06/09/21 239 lb 6.4 oz (108.6 kg)  04/14/21 242 lb 9.6 oz (110 kg)  03/09/21 247 lb 9.6 oz (112.3 kg)    Physical Exam Vitals and nursing note reviewed.  Constitutional:      General: He is not in acute distress.    Appearance: Normal appearance. He is obese. He is not ill-appearing, toxic-appearing or diaphoretic.  HENT:     Head: Normocephalic and atraumatic.     Mouth/Throat:     Mouth: Mucous membranes are moist.  Eyes:     General: No allergic shiner or scleral icterus.       Right eye: No foreign body or discharge.        Left eye: No foreign body or discharge.     Conjunctiva/sclera: Conjunctivae normal.   Cardiovascular:     Rate and Rhythm: Normal rate and regular rhythm.     Heart sounds: Normal heart sounds.  Pulmonary:     Effort: Pulmonary effort is normal.     Breath sounds: Normal breath sounds.  Skin:    General: Skin is warm and dry.     Capillary Refill: Capillary refill takes less than 2 seconds.  Neurological:     General: No focal deficit present.     Mental Status: He is alert and oriented to person, place, and time.  Psychiatric:        Mood and Affect: Mood normal.        Behavior: Behavior normal.        Thought Content: Thought content normal.        Judgment: Judgment normal.    Results for orders placed or performed in visit on 12/11/19  Compliance Drug Analysis, Ur  Result Value Ref Range   Summary Note   CBC with Differential/Platelet  Result Value Ref Range   WBC 9.7 3.4 - 10.8 x10E3/uL    RBC 4.76 4.14 - 5.80 x10E6/uL   Hemoglobin 15.1 13.0 - 17.7 g/dL   Hematocrit 43.2 37.5 - 51.0 %   MCV 91 79 - 97 fL   MCH 31.7 26.6 - 33.0  pg   MCHC 35.0 31.5 - 35.7 g/dL   RDW 12.6 11.6 - 15.4 %   Platelets 167 150 - 450 x10E3/uL   Neutrophils 63 Not Estab. %   Lymphs 28 Not Estab. %   Monocytes 8 Not Estab. %   Eos 1 Not Estab. %   Basos 0 Not Estab. %   Neutrophils Absolute 6.1 1.4 - 7.0 x10E3/uL   Lymphocytes Absolute 2.7 0.7 - 3.1 x10E3/uL   Monocytes Absolute 0.7 0.1 - 0.9 x10E3/uL   EOS (ABSOLUTE) 0.1 0.0 - 0.4 x10E3/uL   Basophils Absolute 0.0 0.0 - 0.2 x10E3/uL   Immature Granulocytes 0 Not Estab. %   Immature Grans (Abs) 0.0 0.0 - 0.1 x10E3/uL  CMP14+EGFR  Result Value Ref Range   Glucose 92 65 - 99 mg/dL   BUN 19 6 - 24 mg/dL   Creatinine, Ser 1.08 0.76 - 1.27 mg/dL   GFR calc non Af Amer 78 >59 mL/min/1.73   GFR calc Af Amer 90 >59 mL/min/1.73   BUN/Creatinine Ratio 18 9 - 20   Sodium 143 134 - 144 mmol/L   Potassium 4.0 3.5 - 5.2 mmol/L   Chloride 108 (H) 96 - 106 mmol/L   CO2 21 20 - 29 mmol/L   Calcium 8.6 (L) 8.7 - 10.2 mg/dL   Total Protein 7.1 6.0 - 8.5 g/dL   Albumin 4.3 3.8 - 4.9 g/dL   Globulin, Total 2.8 1.5 - 4.5 g/dL   Albumin/Globulin Ratio 1.5 1.2 - 2.2   Bilirubin Total 0.5 0.0 - 1.2 mg/dL   Alkaline Phosphatase 64 48 - 121 IU/L   AST 19 0 - 40 IU/L   ALT 28 0 - 44 IU/L  Lipid panel  Result Value Ref Range   Cholesterol, Total 178 100 - 199 mg/dL   Triglycerides 141 0 - 149 mg/dL   HDL 39 (L) >39 mg/dL   VLDL Cholesterol Cal 25 5 - 40 mg/dL   LDL Chol Calc (NIH) 114 (H) 0 - 99 mg/dL   Chol/HDL Ratio 4.6 0.0 - 5.0 ratio       Pertinent labs & imaging results that were available during my care of the patient were reviewed by me and considered in my medical decision making.  Assessment & Plan:  Gardy was seen today for stye.  Diagnoses and all orders for this visit:  Lesion of right lower eyelid Lesion to right lower  eyelid, appears to be a seborrheic keratosis. Due to location and bothersome symptoms, will refer to ophthalmology. Not inflamed in office today. Symptomatic care discussed in detail. Return precautions discussed.  -     Ambulatory referral to Ophthalmology     Continue all other maintenance medications.  Follow up plan: Return if symptoms worsen or fail to improve.   Continue healthy lifestyle choices, including diet (rich in fruits, vegetables, and lean proteins, and low in salt and simple carbohydrates) and exercise (at least 30 minutes of moderate physical activity daily).   The above assessment and management plan was discussed with the patient. The patient verbalized understanding of and has agreed to the management plan. Patient is aware to call the clinic if they develop any new symptoms or if symptoms persist or worsen. Patient is aware when to return to the clinic for a follow-up visit. Patient educated on when it is appropriate to go to the emergency department.   Monia Pouch, FNP-C [facility] [phone]

## 2021-06-20 NOTE — Telephone Encounter (Signed)
New, Roberto  [name], Waldemar Dickens, CMA; Hubert Azure, [facility],   I have reached out to that branch to provide me details. I will let you know what I hear back.   I also ask them to contact the patient to provide details.   Thank you,   Demetrius Charity

## 2021-06-20 NOTE — Telephone Encounter (Signed)
Called and spoke with pt who states that he is still waiting for a new cpap machine and states it has been at least 3 weeks since he was called about having the facial scan done by them.  Stated to pt that we would reach out to [employer], who [facility] is with, and once we have more info for him we would call him to let him know and he verbalized understanding.  Community message sent to [employer]. Will update once have a response.

## 2021-06-21 NOTE — Telephone Encounter (Signed)
New, Bradley  Min Tunnell, Waldemar Dickens, CMA; Hubert Azure, [place],  I Received the following reply to my message :   This patient is now scheduled for 07/07/21 @ 2pm with Jacky Kindle. PT requested to be scheduled out.   Thank you,   Leroy Sea New   Nothing further needed.

## 2021-06-30 ENCOUNTER — Ambulatory Visit: Payer: Medicare Other | Admitting: Pulmonary Disease

## 2021-06-30 ENCOUNTER — Other Ambulatory Visit: Payer: Self-pay

## 2021-06-30 DIAGNOSIS — D23112 Other benign neoplasm of skin of right lower eyelid, including canthus: Secondary | ICD-10-CM | POA: Diagnosis not present

## 2021-06-30 DIAGNOSIS — H5501 Congenital nystagmus: Secondary | ICD-10-CM | POA: Diagnosis not present

## 2021-06-30 DIAGNOSIS — L820 Inflamed seborrheic keratosis: Secondary | ICD-10-CM | POA: Diagnosis not present

## 2021-06-30 DIAGNOSIS — H2703 Aphakia, bilateral: Secondary | ICD-10-CM | POA: Diagnosis not present

## 2021-07-21 ENCOUNTER — Ambulatory Visit: Payer: Medicare Other | Admitting: Pulmonary Disease

## 2021-08-15 ENCOUNTER — Ambulatory Visit (INDEPENDENT_AMBULATORY_CARE_PROVIDER_SITE_OTHER): Payer: Medicare Other | Admitting: Family Medicine

## 2021-08-15 DIAGNOSIS — J069 Acute upper respiratory infection, unspecified: Secondary | ICD-10-CM

## 2021-08-15 DIAGNOSIS — J029 Acute pharyngitis, unspecified: Secondary | ICD-10-CM | POA: Diagnosis not present

## 2021-08-15 NOTE — Progress Notes (Signed)
Telephone visit  Subjective: IF:OYDX throat PCP: Roberto Brooklyn, FNP AJO:INOMVEHMCNO [name] is a 56 y.o. male calls for telephone consult today. Patient provides verbal consent for consult held via phone.  Due to COVID-19 pandemic this visit was conducted virtually. This visit type was conducted due to national recommendations for restrictions regarding the COVID-19 Pandemic (e.g. social distancing, sheltering in place) in an effort to limit this patient's exposure and mitigate transmission in our community. All issues noted in this document were discussed and addressed.  A physical exam was not performed with this format.   Location of patient: home Location of provider: [facility] Others present for call: none  1. Sore throat Started over the weekend.  He feels subjective fevers, chills.  He reports myalgia. No nausea, vomiting, headache.  Mild cough. No shortness of breath.  Chest congestion is present. He is using peroxide gargles and listerine.  Motrin for pain.  Drinking OJ/ taking Vit C.  Works at [employer] in [place], so possible sick contacts.  No vaccine covid.   ROS: Per HPI  No Known Allergies Past Medical History:  Diagnosis Date   Anxiety    Chronic back pain    Chronic neck pain    Eye disorder    OSA (obstructive sleep apnea)    cpap    Current Outpatient Medications:    [employer]S CORTISONE MAXIMUM STRENGTH 1 % lotion, APPLY TO AFFECTED AREA TWICE A DAY, Disp: 99 mL, Rfl: 1   doxycycline (VIBRAMYCIN) 100 MG capsule, Take 100 mg by mouth 2 (two) times daily., Disp: , Rfl:    naproxen (NAPROSYN) 500 MG tablet, TAKE 1 TABLET (500 MG TOTAL) BY MOUTH 2 (TWO) TIMES DAILY WITH A MEAL., Disp: 180 tablet, Rfl: 1  Assessment/ Plan: 56 y.o. male   Sore throat - Plan: Novel Coronavirus, NAA ([facility]), Veritor Flu A/B Waived, Rapid Strep Screen ([facility] ONLY), Culture, Group A Strep  URI with cough and congestion - Plan: Novel Coronavirus, NAA ([facility]),  Veritor Flu A/B Waived, Rapid Strep Screen ([facility] ONLY), Culture, Group A Strep  I have ordered viral and bacterial testing for this patient.  He will continue supportive care at home.  We discussed red flag signs and symptoms warranting further evaluation.  He voiced good understanding.  He is going to try and get up here before Friday but understands that if he starts having any concerning symptoms prior to that time he will try and be evaluated sooner.  Start time: 10:53am End time: 10:58am  Total time spent on patient care (including telephone call/ virtual visit): 5 minutes  [place], [place] [phone]

## 2021-09-15 ENCOUNTER — Ambulatory Visit: Payer: Medicare Other | Admitting: Pulmonary Disease

## 2021-10-02 ENCOUNTER — Encounter: Payer: Self-pay | Admitting: Pulmonary Disease

## 2021-10-02 ENCOUNTER — Ambulatory Visit (INDEPENDENT_AMBULATORY_CARE_PROVIDER_SITE_OTHER): Payer: Medicare Other | Admitting: Pulmonary Disease

## 2021-10-02 VITALS — BP 120/70 | HR 72 | Temp 98.3°F | Ht 68.0 in | Wt 232.6 lb

## 2021-10-02 DIAGNOSIS — Z9989 Dependence on other enabling machines and devices: Secondary | ICD-10-CM

## 2021-10-02 DIAGNOSIS — G4733 Obstructive sleep apnea (adult) (pediatric): Secondary | ICD-10-CM

## 2021-10-02 NOTE — Progress Notes (Signed)
? ?      ?Roberto Hanson    [id number]    December 20, 1965 ? ?Primary Care Physician:Joyce, Shireen Quan, FNP ? ?Referring Physician: Loman Brooklyn, FNP ?[address] [place],  [place] [address] ? ?Chief complaint:   ?Patient with a history of obstructive sleep apnea ?In for follow-up today ? ?HPI: ? ?History of obstructive sleep apnea ? ?Did have his machine recently updated ? ?Uses CPAP nightly ?Wakes up feeling like he is at a good nights rest ? ?Diagnosed at [facility] ?Was on CPAP of 13 ? ?Had CPAP since 2007 ? ?History of anxiety, chronic back pain, sleep apnea for which he is compliant with CPAP ? ?Wakes up feeling rested ? ? ?Outpatient Encounter Medications as of 10/02/2021  ?Medication Sig  ? CVS CORTISONE MAXIMUM STRENGTH 1 % lotion APPLY TO AFFECTED AREA TWICE A DAY  ? doxycycline (VIBRAMYCIN) 100 MG capsule Take 100 mg by mouth 2 (two) times daily.  ? naproxen (NAPROSYN) 500 MG tablet TAKE 1 TABLET (500 MG TOTAL) BY MOUTH 2 (TWO) TIMES DAILY WITH A MEAL.  ? ?No facility-administered encounter medications on file as of 10/02/2021.  ? ? ?Allergies as of 10/02/2021  ? (No Known Allergies)  ? ? ?Past Medical History:  ?Diagnosis Date  ? Anxiety   ? Chronic back pain   ? Chronic neck pain   ? Eye disorder   ? OSA (obstructive sleep apnea)   ? cpap  ? ? ?Past Surgical History:  ?Procedure Laterality Date  ? COLONOSCOPY WITH PROPOFOL N/A 05/22/2017  ? Procedure: COLONOSCOPY WITH PROPOFOL;  Surgeon: [name], MD;  [facility];  Service: Endoscopy;  Laterality: N/A;  12:00pm  ? EYE SURGERY    ? POLYPECTOMY  05/22/2017  ? Procedure: POLYPECTOMY;  Surgeon: [name], MD;  [facility];  Service: Endoscopy;;  colon  ? ? ?Family History  ?Problem Relation Age of Onset  ? Diabetes Mother   ? Diabetes Father   ? Diabetes Brother   ? Glaucoma Sister   ? Healthy Daughter   ? Glaucoma Brother   ? Diabetes Brother   ? Glaucoma Sister   ? Colon cancer Neg Hx   ? ? ?Social History   ? ?Socioeconomic History  ? Marital status: Significant Other  ?  Spouse name: Not on file  ? Number of children: 1  ? Years of education: Not on file  ? Highest education level: 11th grade  ?Occupational History  ? Occupation: employed  ?Tobacco Use  ? Smoking status: Former  ?  Packs/day: 0.25  ?  Years: 34.00  ?  Pack years: 8.50  ?  Types: Cigarettes  ?  Quit date: 04/19/2015  ?  Years since quitting: 6.4  ? Smokeless tobacco: Never  ?Vaping Use  ? Vaping Use: Never used  ?Substance and Sexual Activity  ? Alcohol use: No  ?  Comment: rare  ? Drug use: No  ? Sexual activity: Not Currently  ?Other Topics Concern  ? Not on file  ?Social History Narrative  ? 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 Girlfriend assists with transportation, cooking, etc  ? ?Social Determinants of Health  ? ?Financial Resource Strain: Low Risk   ? Difficulty of Paying Living Expenses: Not hard at all  ?Food Insecurity: No Food Insecurity  ? Worried About Charity fundraiser in the Last Year: Never true  ? Ran Out of Food in the Last Year: Never  true  ?Transportation Needs: No Transportation Needs  ? Lack of Transportation (Medical): No  ? Lack of Transportation (Non-Medical): No  ?Physical Activity: Insufficiently Active  ? Days of Exercise per Week: 7 days  ? Minutes of Exercise per Session: 10 min  ?Stress: No Stress Concern Present  ? Feeling of Stress : Only a little  ?Social Connections: Moderately Integrated  ? Frequency of Communication with Friends and Family: More than three times a week  ? Frequency of Social Gatherings with Friends and Family: More than three times a week  ? Attends Religious Services: More than 4 times per year  ? Active Member of Clubs or Organizations: Yes  ? Attends Archivist Meetings: More than 4 times per year  ? Marital Status: Separated  ?Intimate Partner Violence: Not At Risk  ? Fear of Current or Ex-Partner: No  ? Emotionally Abused: No  ? Physically Abused: No  ? Sexually Abused: No  ? ? ?Review  of Systems  ?Constitutional:  Negative for fatigue.  ?Respiratory:  Positive for apnea.   ?Psychiatric/Behavioral:  Positive for sleep disturbance.   ? ?Vitals:  ? 10/02/21 1344  ?BP: 120/70  ?Pulse: 72  ?Temp: 98.3 ?F (36.8 ?C)  ?SpO2: 96%  ? ? ? ?Physical Exam ?Constitutional:   ?   Appearance: He is obese.  ?HENT:  ?   Head: Normocephalic.  ?   Mouth/Throat:  ?   Mouth: Mucous membranes are moist.  ?   Comments: Mallampati 4, crowded oropharynx ?Eyes:  ?   Pupils: Pupils are equal, round, and reactive to light.  ?Cardiovascular:  ?   Rate and Rhythm: Normal rate and regular rhythm.  ?   Heart sounds: No murmur heard. ?  No friction rub.  ?Pulmonary:  ?   Effort: No respiratory distress.  ?   Breath sounds: No stridor. No wheezing or rhonchi.  ?Musculoskeletal:  ?   Cervical back: No rigidity or tenderness.  ?Neurological:  ?   Mental Status: He is alert.  ?Psychiatric:     ?   Mood and Affect: Mood normal.  ? ? ?  04/14/2021  ?  2:00 PM  ?Results of the Epworth flowsheet  ?Sitting and reading 2  ?Watching TV 3  ?Sitting, inactive in a public place (e.g. a theatre or a meeting) 2  ?As a passenger in a car for an hour without a break 2  ?Lying down to rest in the afternoon when circumstances permit 3  ?Sitting and talking to someone 2  ?Sitting quietly after a lunch without alcohol 2  ?In a car, while stopped for a few minutes in traffic 0  ?Total score 16  ? ? ?Data Reviewed: ?Download from the machine shows 100% compliance with CPAP ?Average use of 7 hours 26 minutes ?Machine set between 5 and 20 ?Residual AHI of 3 ? ?Assessment:  ?History of obstructive sleep apnea ?Appears well treated with CPAP therapy- ?-Continues to benefit from CPAP ? ?Machine was recently updated ? ?Class II obesity ? ? ?Plan/Recommendations: ?Continue auto CPAP ? ?As he is doing very well with CPAP with no significant concerns ?We will see him in a year ? ? ?Encouraged to call with any significant concerns ? ? ?Sherrilyn Rist MD ?[facility] ?10/02/2021, 2:01 PM ? ?CC: Loman Brooklyn, FNP ? ? ? ?

## 2021-10-02 NOTE — Patient Instructions (Signed)
Continue using CPAP on a nightly basis ? ?I will see you back in about a year ? ?Call with any significant concerns ?

## 2021-10-29 IMAGING — DX DG LUMBAR SPINE 2-3V
2 series · 2 of 2 positions shown · non-contrast
Comparison: None.

CLINICAL DATA: Low back pain

EXAM:
LUMBAR SPINE - 2-3 VIEW

[l-spine ap]
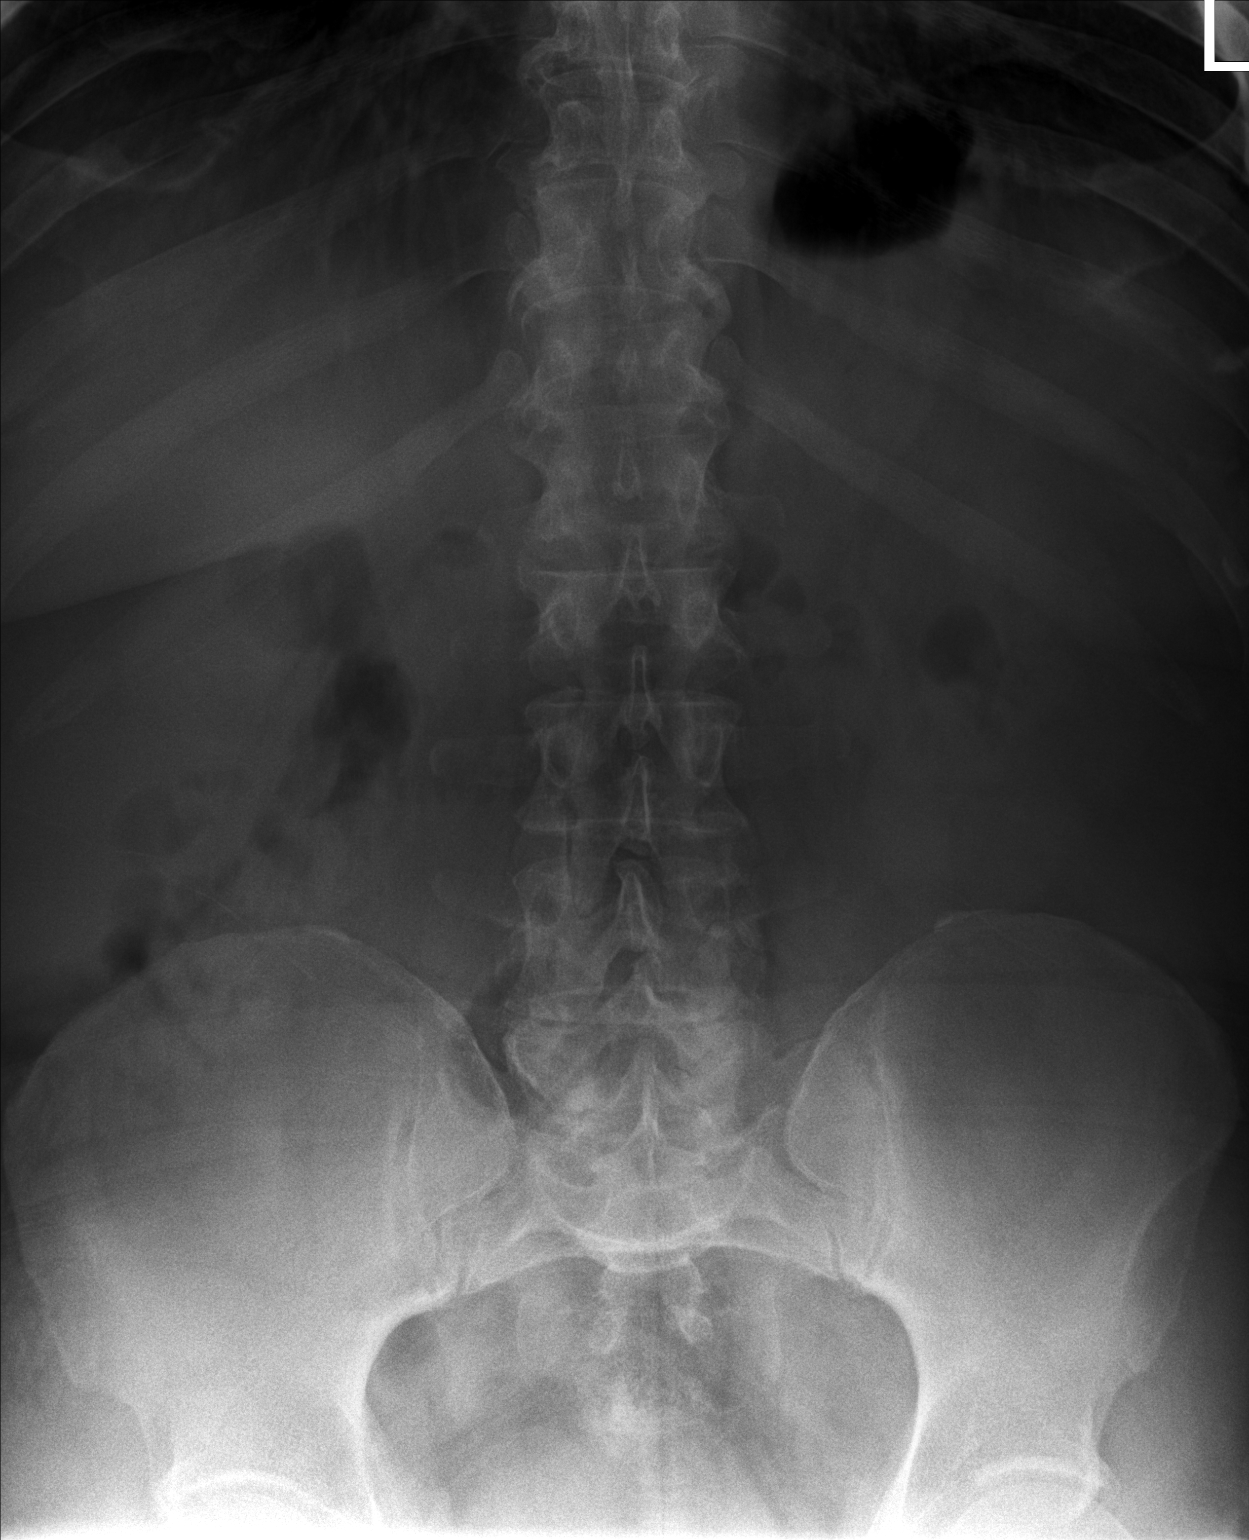

[l-spine lat]
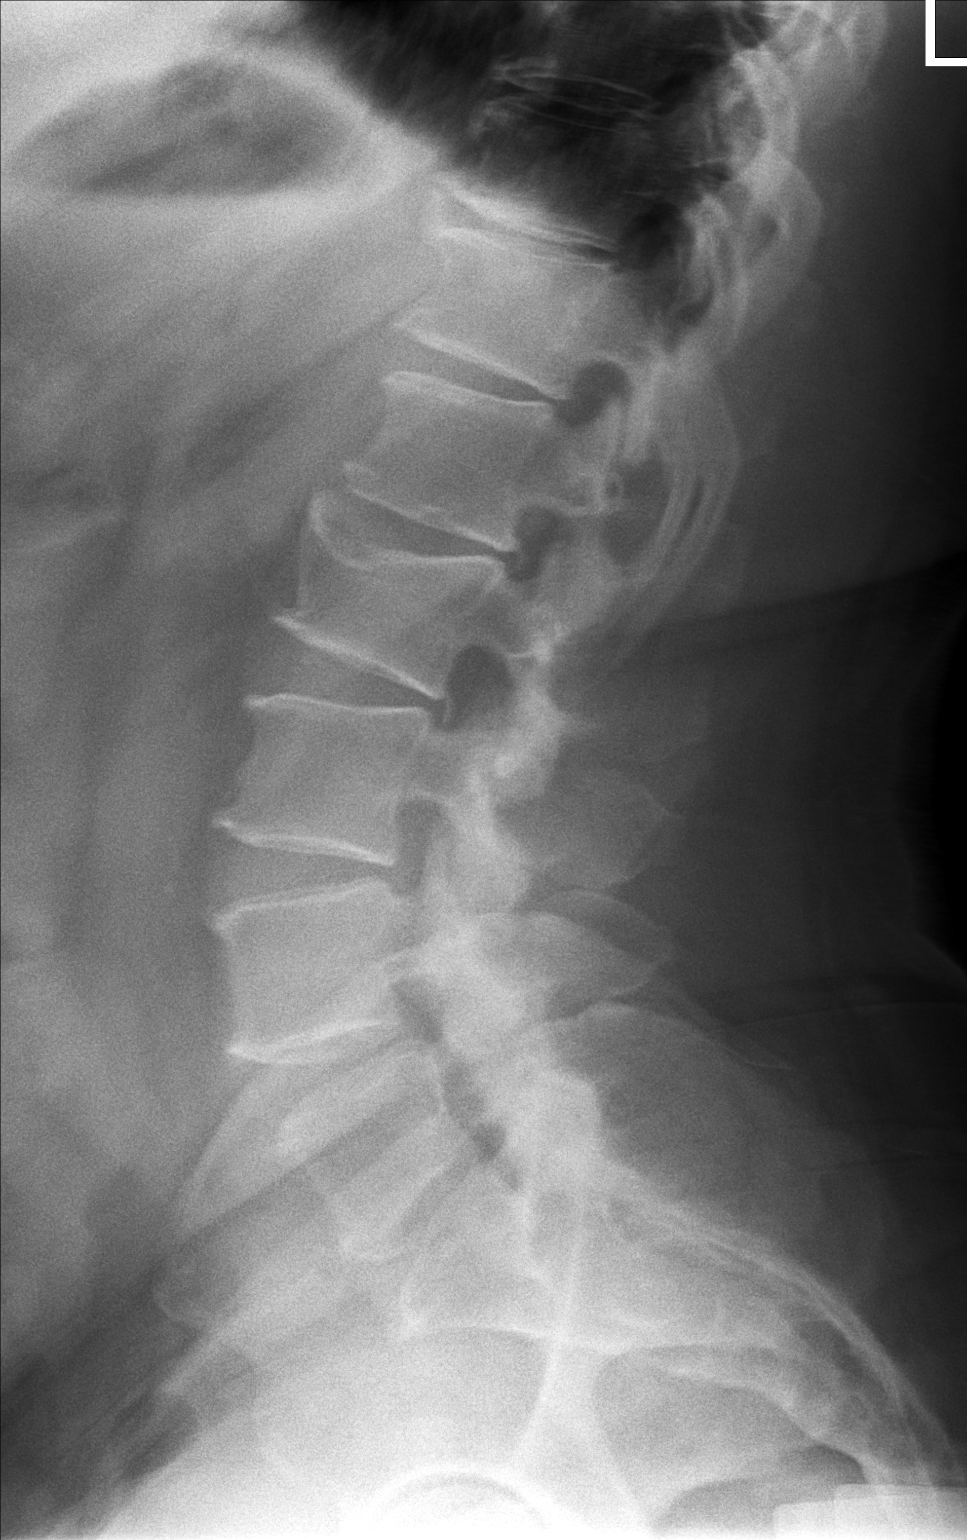

[2 of 2 positions shown; findings below may reference images not displayed]

FINDINGS: Frontal and lateral views were obtained. There are 5 non-rib-bearing
lumbar type vertebral bodies. There is age uncertain anterior
wedging of the L2 vertebral body. No other fracture evident. No
spondylolisthesis. There is mild disc space narrowing at L1-2. There
is moderate disc space narrowing at L4-5 and L5-S1. There is facet
osteoarthritic change at L4-5 and L5-S1 bilaterally. No erosive
change.
IMPRESSION: Age uncertain moderate anterior wedging of the L2 vertebral body. No
other fracture. No appreciable spondylolisthesis. Disc space
narrowing at L4-5 and L5-S1 as well as to a lesser extent at L1-2.

## 2022-03-06 ENCOUNTER — Ambulatory Visit (INDEPENDENT_AMBULATORY_CARE_PROVIDER_SITE_OTHER): Payer: Medicare Other

## 2022-03-06 DIAGNOSIS — Z0000 Encounter for general adult medical examination without abnormal findings: Secondary | ICD-10-CM | POA: Diagnosis not present

## 2022-03-06 NOTE — Progress Notes (Signed)
MEDICARE ANNUAL WELLNESS VISIT  03/06/2022  Telephone Visit Disclaimer This Medicare AWV was conducted by telephone due to national recommendations for restrictions regarding the COVID-19 Pandemic (e.g. social distancing).  I verified, using two identifiers, that I am speaking with Roberto Hanson or their authorized healthcare agent. I discussed the limitations, risks, security, and privacy concerns of performing an evaluation and management service by telephone and the potential availability of an in-person appointment in the future. The patient expressed understanding and agreed to proceed.  Location of Patient: Home Location of Provider (nurse):  [facility]  Subjective:    NESHAWN Hanson is a 56 y.o. male patient of Loman Brooklyn, FNP who had a Medicare Annual Wellness Visit today via telephone. Aryan is Working full time and lives alone. He has one child and two grandchildren.  He reports that he is socially active and does interact with friends/family regularly. He is minimally physically active and enjoys playing chess and playing the drums.  Patient Care Team: Loman Brooklyn, FNP as PCP - General (Family Medicine) [name], MD as Consulting Physician (Gastroenterology)     03/06/2022    9:18 AM 03/03/2021    9:14 AM 10/23/2019    2:12 PM 05/16/2017   10:16 AM 04/23/2017    1:28 PM 11/16/2016    4:13 PM  Advanced Directives  Does Patient Have a Medical Advance Directive? No No No No No No  Would patient like information on creating a medical advance directive? No - Patient declined No - Patient declined No - Patient declined No - Patient declined  Yes (MAU/Ambulatory/Procedural Areas - Information given)    Hospital Utilization Over the Past 12 Months: # of hospitalizations or ER visits: 0 # of surgeries: 0  Review of Systems    Patient reports that his overall health is unchanged compared to last year.  History obtained from chart review and the  patient  Patient Reported Readings (BP, Pulse, CBG, Weight, etc) none  Pain Assessment Pain : No/denies pain     Current Medications & Allergies (verified) Allergies as of 03/06/2022   No Known Allergies      Medication List        Accurate as of March 06, 2022  9:23 AM. If you have any questions, ask your nurse or doctor.          STOP taking these medications    [facility] Cortisone Maximum Strength 1 % lotion Generic drug: hydrocortisone   doxycycline 100 MG capsule Commonly known as: VIBRAMYCIN       TAKE these medications    naproxen 500 MG tablet Commonly known as: NAPROSYN TAKE 1 TABLET (500 MG TOTAL) BY MOUTH 2 (TWO) TIMES DAILY WITH A MEAL.        History (reviewed): Past Medical History:  Diagnosis Date   Anxiety    Chronic back pain    Chronic neck pain    Eye disorder    OSA (obstructive sleep apnea)    cpap   Past Surgical History:  Procedure Laterality Date   COLONOSCOPY WITH PROPOFOL N/A 05/22/2017   Procedure: COLONOSCOPY WITH PROPOFOL;  Surgeon: [name], MD;  [facility];  Service: Endoscopy;  Laterality: N/A;  12:00pm   EYE SURGERY     POLYPECTOMY  05/22/2017   Procedure: POLYPECTOMY;  Surgeon: [name], MD;  Location: [facility];  Service: Endoscopy;;  colon   Family History  Problem Relation Age of Onset  Diabetes Mother    Diabetes Father    Diabetes Brother    Glaucoma Sister    Healthy Daughter    Glaucoma Brother    Diabetes Brother    Glaucoma Sister    Colon cancer Neg Hx    Social History   Socioeconomic History   Marital status: Significant Other    Spouse name: Not on file   Number of children: 1   Years of education: Not on file   Highest education level: 11th grade  Occupational History   Occupation: employed  Tobacco Use   Smoking status: Former    Packs/day: 0.25    Years: 34.00    Total pack years: 8.50    Types: Cigarettes    Quit date: 04/19/2015    Years since  quitting: 6.8   Smokeless tobacco: Never  Vaping Use   Vaping Use: Never used  Substance and Sexual Activity   Alcohol use: No    Comment: rare   Drug use: No   Sexual activity: Not Currently  Other Topics Concern   Not on file  Social History Narrative   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   Girlfriend assists with transportation, cooking, etc   Social Determinants of Health   Financial Resource Strain: [place]  (03/03/2021)   Overall Financial Resource Strain (CARDIA)    Difficulty of Paying Living Expenses: Not hard at all  Food Insecurity: No Food Insecurity (03/03/2021)   Hunger Vital Sign    Worried About Running Out of Food in the Last Year: Never true    [place] in the Last Year: Never true  Transportation Needs: No Transportation Needs (03/03/2021)   PRAPARE - Hydrologist (Medical): No    Lack of Transportation (Non-Medical): No  Physical Activity: Insufficiently Active (03/03/2021)   Exercise Vital Sign    Days of Exercise per Week: 7 days    Minutes of Exercise per Session: 10 min  Stress: No Stress Concern Present (03/03/2021)   [place]    Feeling of Stress : Only a little  Social Connections: Moderately Integrated (03/03/2021)   Social Connection and Isolation Panel [NHANES]    Frequency of Communication with Friends and Family: More than three times a week    Frequency of Social Gatherings with Friends and Family: More than three times a week    Attends Religious Services: More than 4 times per year    Active Member of [employer] or Organizations: Yes    Attends Archivist Meetings: More than 4 times per year    Marital Status: Separated    Activities of Daily Living    03/06/2022    9:19 AM  In your present state of health, do you have any difficulty performing the following activities:  Hearing? 0  Vision? 0  Difficulty concentrating or making decisions?  0  Walking or climbing stairs? 0  Dressing or bathing? 0  Doing errands, shopping? 0  Preparing Food and eating ? N  Using the Toilet? N  In the past six months, have you accidently leaked urine? N  Do you have problems with loss of bowel control? N  Managing your Medications? N  Managing your Finances? N  Housekeeping or managing your Housekeeping? N    Patient Education/ Literacy How often do you need to have someone help you when you read instructions, pamphlets, or other written materials from your doctor or  pharmacy?: 1 - Never What is the last grade level you completed in school?: 11th grade  Exercise Current Exercise Habits: The patient does not participate in regular exercise at present, Exercise limited by: orthopedic condition(s)  Diet Patient reports consuming 2 meals a day and 2 snack(s) a day Patient reports that his primary diet is: Regular Patient reports that he does have regular access to food.   Depression Screen    03/06/2022    9:23 AM 06/09/2021    3:36 PM 03/09/2021    2:31 PM 03/03/2021    9:36 AM 12/11/2019    3:48 PM 10/23/2019    2:13 PM 10/17/2018    2:38 PM  PHQ 2/9 Scores  PHQ - 2 Score 0 0 0 0 0 0 0  PHQ- 9 Score   0  0       Fall Risk    03/06/2022    9:22 AM 06/09/2021    3:36 PM 03/09/2021    2:31 PM 03/03/2021    9:39 AM 12/11/2019    3:39 PM  [place] in the past year?  0 0 0 0  Number falls in past yr:    0   Injury with Fall?    0   Risk for fall due to :    Impaired vision;Orthopedic patient   Follow up Falls evaluation completed   Falls prevention discussed      Objective:  MARKEISE MATHEWS seemed alert and oriented and he participated appropriately during our telephone visit.  Blood Pressure Weight BMI  BP Readings from Last 3 Encounters:  10/02/21 120/70  06/09/21 113/78  04/14/21 (!) 132/56   Wt Readings from Last 3 Encounters:  10/02/21 232 lb 9.6 oz (105.5 kg)  06/09/21 239 lb 6.4 oz (108.6 kg)  04/14/21  242 lb 9.6 oz (110 kg)   BMI Readings from Last 1 Encounters:  10/02/21 35.37 kg/m    *Unable to obtain current vital signs, weight, and BMI due to telephone visit type  Hearing/Vision  Noa did not seem to have difficulty with hearing/understanding during the telephone conversation Reports that he has had a formal eye exam by an eye care professional within the past year Reports that he has not had a formal hearing evaluation within the past year *Unable to fully assess hearing and vision during telephone visit type  Cognitive Function:    03/06/2022    9:20 AM 10/23/2019    2:14 PM 10/17/2018    2:39 PM  6CIT Screen  What Year? 0 points 0 points 0 points  What month? 0 points 0 points 0 points  What time? 0 points 0 points 0 points  Count back from 20 0 points 0 points 0 points  Months in reverse 0 points 0 points 0 points  Repeat phrase 0 points 0 points 0 points  Total Score 0 points 0 points 0 points   (Normal:0-7, Significant for Dysfunction: >8)  Normal Cognitive Function Screening: Yes   Immunization & Health Maintenance Record Immunization History  Administered Date(s) Administered   Tdap 11/16/2016    Health Maintenance  Topic Date Due   COVID-19 Vaccine (1) Never done   HIV Screening  Never done   Hepatitis C Screening  Never done   Zoster Vaccines- Shingrix (1 of 2) Never done   INFLUENZA VACCINE  09/16/2022 (Originally 01/16/2022)   TETANUS/TDAP  11/17/2026   COLONOSCOPY (Pts 45-81yr Insurance coverage will need to be confirmed)  05/23/2027  HPV VACCINES  Aged Out       Assessment  This is a routine wellness examination for ERICBERTO PADGET.  Health Maintenance: Due or Overdue Health Maintenance Due  Topic Date Due   COVID-19 Vaccine (1) Never done   HIV Screening  Never done   Hepatitis C Screening  Never done   Zoster Vaccines- Shingrix (1 of 2) Never done    Roberto Hanson does not need a referral for Community  Assistance: Care Management:   no Social Work:    no Prescription Assistance:  no Nutrition/Diabetes Education:  no   Plan:  Personalized Goals  Goals Addressed             This Visit's Progress    Patient Stated       03/06/2022 AWV Goal: Exercise for General Health  Patient will verbalize understanding of the benefits of increased physical activity: Exercising regularly is important. It will improve your overall fitness, flexibility, and endurance. Regular exercise also will improve your overall health. It can help you control your weight, reduce stress, and improve your bone density. Over the next year, patient will increase physical activity as tolerated with a goal of at least 150 minutes of moderate physical activity per week.  You can tell that you are exercising at a moderate intensity if your heart starts beating faster and you start breathing faster but can still hold a conversation. Moderate-intensity exercise ideas include: Walking 1 mile (1.6 km) in about 15 minutes Biking Hiking Golfing Dancing Water aerobics Patient will verbalize understanding of everyday activities that increase physical activity by providing examples like the following: Yard work, such as: Sales promotion account executive Gardening Washing windows or floors Patient will be able to explain general safety guidelines for exercising:  Before you start a new exercise program, talk with your health care provider. Do not exercise so much that you hurt yourself, feel dizzy, or get very short of breath. Wear comfortable clothes and wear shoes with good support. Drink plenty of water while you exercise to prevent dehydration or heat stroke. Work out until your breathing and your heartbeat get faster.        Personalized Health Maintenance & Screening Recommendations  Shingrix vaccine  Lung Cancer Screening Recommended:  yes (Low Dose CT Chest recommended if Age 21-80 years, 30 pack-year currently smoking OR have quit w/in past 15 years) Hepatitis C Screening recommended: yes HIV Screening recommended: yes  Advanced Directives: Written information was not prepared per patient's request.  Referrals & Orders No orders of the defined types were placed in this encounter.   Follow-up Plan Follow-up with Loman Brooklyn, FNP as planned    I have personally reviewed and noted the following in the patient's chart:   Medical and social history Use of alcohol, tobacco or illicit drugs  Current medications and supplements Functional ability and status Nutritional status Physical activity Advanced directives List of other physicians Hospitalizations, surgeries, and ER visits in previous 12 months Vitals Screenings to include cognitive, depression, and falls Referrals and appointments  In addition, I have reviewed and discussed with Roberto Hanson certain preventive protocols, quality metrics, and best practice recommendations. A written personalized care plan for preventive services as well as general preventive health recommendations is available and can be mailed to the patient at his request.      Burnadette Pop  03/06/2022  Patient declined after visit  summary.

## 2022-04-06 ENCOUNTER — Encounter: Payer: Self-pay | Admitting: Nurse Practitioner

## 2022-04-06 ENCOUNTER — Ambulatory Visit (INDEPENDENT_AMBULATORY_CARE_PROVIDER_SITE_OTHER): Payer: Medicare Other | Admitting: Nurse Practitioner

## 2022-04-06 VITALS — BP 112/64 | HR 75 | Temp 98.7°F | Ht 68.0 in | Wt 242.0 lb

## 2022-04-06 DIAGNOSIS — Z1211 Encounter for screening for malignant neoplasm of colon: Secondary | ICD-10-CM | POA: Diagnosis not present

## 2022-04-06 NOTE — Progress Notes (Signed)
   Acute Office Visit  Subjective:     Patient ID: Roberto Hanson, male    DOB: 07/09/1965, 56 y.o.   MRN: [id number]  Chief Complaint  Patient presents with   Referral    GI in [place]     HPI  Patient is a 56 year old male who presents to clinic for  GI referral. Patient completed colonoscopy in 2018 and had benign polys removed. He was supposed to follow in 3 years but was unable to follow up. Patient denies any new symptoms, no constipation or changes in bowel partern  Review of Systems  Constitutional: Negative.   HENT: Negative.    Respiratory: Negative.    Cardiovascular: Negative.   Musculoskeletal: Negative.   Skin: Negative.  Negative for itching and rash.  All other systems reviewed and are negative.       Objective:    BP 112/64   Pulse 75   Temp 98.7 F (37.1 C)   Ht '5\' 8"'$  (1.727 m)   Wt 242 lb (109.8 kg)   SpO2 93%   BMI 36.80 kg/m  BP Readings from Last 3 Encounters:  04/06/22 112/64  10/02/21 120/70  06/09/21 113/78   Wt Readings from Last 3 Encounters:  04/06/22 242 lb (109.8 kg)  10/02/21 232 lb 9.6 oz (105.5 kg)  06/09/21 239 lb 6.4 oz (108.6 kg)      Physical Exam Vitals and nursing note reviewed.  Constitutional:      Appearance: Normal appearance.  HENT:     Head: Normocephalic.     Right Ear: External ear normal.     Left Ear: External ear normal.     Nose: Nose normal.     Mouth/Throat:     Mouth: Mucous membranes are moist.     Pharynx: Oropharynx is clear.  Eyes:     Conjunctiva/sclera: Conjunctivae normal.  Cardiovascular:     Rate and Rhythm: Normal rate and regular rhythm.     Pulses: Normal pulses.     Heart sounds: Normal heart sounds.  Pulmonary:     Effort: Pulmonary effort is normal.     Breath sounds: Normal breath sounds.  Abdominal:     General: Bowel sounds are normal.  Neurological:     Mental Status: He is alert.     No results found for any visits on 04/06/22.      Assessment & Plan:   GI referral completed, education provided to patient.  Patient knows to wait 1-14 days for referrals to be completed. Problem List Items Addressed This Visit   None Visit Diagnoses     Colon cancer screening    -  Primary   Relevant Orders   Ambulatory referral to Gastroenterology       No orders of the defined types were placed in this encounter.   Return if symptoms worsen or fail to improve.  Ivy Lynn, NP

## 2022-04-06 NOTE — Patient Instructions (Signed)
Colonoscopy, Adult A colonoscopy is a procedure to look at the entire large intestine. This procedure is done using a long, thin, flexible tube that has a camera on the end. You may have a colonoscopy: As a part of normal colorectal screening. If you have certain symptoms, such as: A low number of red blood cells in your blood (anemia). Diarrhea that does not go away. Pain in your abdomen. Blood in your stool. A colonoscopy can help screen for and diagnose medical problems, including: An abnormal growth of cells or tissue (tumor). Abnormal growths within the lining of your intestine (polyps). Inflammation. Areas of bleeding. Tell your health care provider about: Any allergies you have. All medicines you are taking, including vitamins, herbs, eye drops, creams, and over-the-counter medicines. Any problems you or family members have had with anesthetic medicines. Any bleeding problems you have. Any surgeries you have had. Any medical conditions you have. Any problems you have had with having bowel movements. Whether you are pregnant or may be pregnant. What are the risks? Generally, this is a safe procedure. However, problems may occur, including: Bleeding. Damage to your intestine. Allergic reactions to medicines given during the procedure. Infection. This is rare. What happens before the procedure? Eating and drinking restrictions Follow instructions from your health care provider about eating or drinking restrictions, which may include: A few days before the procedure: Follow a low-fiber diet. Avoid nuts, seeds, dried fruit, raw fruits, and vegetables. 1-3 days before the procedure: Eat only gelatin dessert or ice pops. Drink only clear liquids, such as water, clear juice, clear broth or bouillon, black coffee or tea, or clear soft drinks or sports drinks. Avoid liquids that contain red or purple dye. The day of the procedure: Do not eat solid foods. You may continue to drink  clear liquids until up to 2 hours before the procedure. Do not eat or drink anything starting 2 hours before the procedure, or within the time period that your health care provider recommends. Bowel prep If you were prescribed a bowel prep to take by mouth (orally) to clean out your colon: Take it as told by your health care provider. Starting the day before your procedure, you will need to drink a large amount of liquid medicine. The liquid will cause you to have many bowel movements of loose stool until your stool becomes almost clear or light green. If your skin or the opening between the buttocks (anus) gets irritated from diarrhea, you may relieve the irritation using: Wipes with medicine in them, such as adult wet wipes with aloe and vitamin E. A product to soothe skin, such as petroleum jelly. If you vomit while drinking the bowel prep: Take a break for up to 60 minutes. Begin the bowel prep again. Call your health care provider if you keep vomiting or you cannot take the bowel prep without vomiting. To clean out your colon, you may also be given: Laxative medicines. These help you have a bowel movement. Instructions for enema use. An enema is liquid medicine injected into your rectum. Medicines Ask your health care provider about: Changing or stopping your regular medicines or supplements. This is especially important if you are taking iron supplements, diabetes medicines, or blood thinners. Taking medicines such as aspirin and ibuprofen. These medicines can thin your blood. Do not take these medicines unless your health care provider tells you to take them. Taking over-the-counter medicines, vitamins, herbs, and supplements. General instructions Ask your health care provider what steps will be  taken to help prevent infection. These may include washing skin with a germ-killing soap. If you will be going home right after the procedure, plan to have a responsible adult: Take you home  from the hospital or clinic. You will not be allowed to drive. Care for you for the time you are told. What happens during the procedure?  An IV will be inserted into one of your veins. You will be given a medicine to make you fall asleep (general anesthetic). You will lie on your side with your knees bent. A lubricant will be put on the tube. Then the tube will be: Inserted into your anus. Gently eased through all parts of your large intestine. Air will be sent into your colon to keep it open. This may cause some pressure or cramping. Images will be taken with the camera and will appear on a screen. A small tissue sample may be removed to be looked at under a microscope (biopsy). The tissue may be sent to a lab for testing if any signs of problems are found. If small polyps are found, they may be removed and checked for cancer cells. When the procedure is finished, the tube will be removed. The procedure may vary among health care providers and hospitals. What happens after the procedure? Your blood pressure, heart rate, breathing rate, and blood oxygen level will be monitored until you leave the hospital or clinic. You may have a small amount of blood in your stool. You may pass gas and have mild cramping or bloating in your abdomen. This is caused by the air that was used to open your colon during the exam. If you were given a sedative during the procedure, it can affect you for several hours. Do not drive or operate machinery until your health care provider says that it is safe. It is up to you to get the results of your procedure. Ask your health care provider, or the department that is doing the procedure, when your results will be ready. Summary A colonoscopy is a procedure to look at the entire large intestine. Follow instructions from your health care provider about eating and drinking before the procedure. If you were prescribed an oral bowel prep to clean out your colon, take it  as told by your health care provider. During the colonoscopy, a flexible tube with a camera on its end is inserted into the anus and then passed into all parts of the large intestine. This information is not intended to replace advice given to you by your health care provider. Make sure you discuss any questions you have with your health care provider. Document Revised: 05/29/2021 Document Reviewed: 01/25/2021 Elsevier Patient Education  [place].

## 2022-04-11 ENCOUNTER — Encounter: Payer: Self-pay | Admitting: *Deleted

## 2022-05-08 ENCOUNTER — Encounter: Payer: Self-pay | Admitting: *Deleted

## 2022-05-08 NOTE — Patient Instructions (Signed)
  Procedure: colonoscopy  Estimated body mass index is 36.49 kg/m as calculated from the following:   Height as of this encounter: '5\' 8"'$  (1.727 m).   Weight as of this encounter: 240 lb (108.9 kg).   Have you had a colonoscopy before?  Yes, 2018, Dr. Gala Romney  Do you have family history of colon cancer?  No  Do you have a family history of polyps? No  Previous colonoscopy with polyps removed? Yes  Do you have a history colorectal cancer?   No  Are you diabetic?  No  Do you have a prosthetic or mechanical heart valve? No  Do you have a pacemaker/defibrillator?   No  Have you had endocarditis/atrial fibrillation?  No  Do you use supplemental oxygen/CPAP?  No  Have you had joint replacement within the last 12 months?  No  Do you tend to be constipated or have to use laxatives?  No   Do you have history of alcohol use? If yes, how much and how often.  No  Do you have history or are you using drugs? If yes, what do are you  using?  No  Have you ever had a stroke/heart attack?  No  Have you ever had a heart or other vascular stent placed,?No  Do you take weight loss medication? No  Do you take any blood-thinning medications such as: (Plavix, aspirin, Coumadin, Aggrenox, Brilinta, Xarelto, Eliquis, Pradaxa, Savaysa or Effient)? No  If yes we need the name, milligram, dosage and who is prescribing doctor:  N/A             Current Outpatient Medications  Medication Sig Dispense Refill   naproxen (NAPROSYN) 500 MG tablet TAKE 1 TABLET (500 MG TOTAL) BY MOUTH 2 (TWO) TIMES DAILY WITH A MEAL. 180 tablet 1   No current facility-administered medications for this visit.    No Known Allergies \

## 2022-05-09 DIAGNOSIS — B353 Tinea pedis: Secondary | ICD-10-CM | POA: Diagnosis not present

## 2022-05-28 NOTE — Progress Notes (Signed)
Last colonoscopy in Oct 2018: five 4-6 mm polyps in rectum, sigmoid, ascending colon. One 88 mm polyp in transverse, one 6 mm polyp in rectum. Tubular adenomas and hyperplastic polyp. He had been due for 3 year surveillance; letter was sent.   ASA 2. Appropriate.

## 2022-05-31 ENCOUNTER — Encounter: Payer: Self-pay | Admitting: *Deleted

## 2022-05-31 MED ORDER — NA SULFATE-K SULFATE-MG SULF 17.5-3.13-1.6 GM/177ML PO SOLN: ORAL | 0 refills | Status: DC

## 2022-05-31 NOTE — Progress Notes (Signed)
Pt has been scheduled for 07/05/22 at 11:00 am with Dr.Rourk. Instructions mailed and prep sent to the pharmacy

## 2022-07-02 ENCOUNTER — Encounter: Payer: Self-pay | Admitting: *Deleted

## 2022-07-02 ENCOUNTER — Telehealth: Payer: Self-pay | Admitting: *Deleted

## 2022-07-02 NOTE — Telephone Encounter (Signed)
Pt hadn't received prep instructions in the mail. Sent instructions to pt via MyChart.

## 2022-07-02 NOTE — Telephone Encounter (Signed)
Pt left vm wanting a return call.  LMTRC 

## 2022-07-05 ENCOUNTER — Other Ambulatory Visit: Payer: Self-pay

## 2022-07-05 ENCOUNTER — Ambulatory Visit (HOSPITAL_COMMUNITY): Payer: Medicare (Managed Care) | Admitting: Certified Registered Nurse Anesthetist

## 2022-07-05 ENCOUNTER — Ambulatory Visit (HOSPITAL_BASED_OUTPATIENT_CLINIC_OR_DEPARTMENT_OTHER): Payer: Medicare (Managed Care) | Admitting: Certified Registered Nurse Anesthetist

## 2022-07-05 ENCOUNTER — Encounter (HOSPITAL_COMMUNITY)
Admission: RE | Disposition: A | Discharge status: Home / Self Care | Payer: Self-pay | Source: Ambulatory Visit | Attending: Internal Medicine

## 2022-07-05 ENCOUNTER — Encounter (HOSPITAL_COMMUNITY): Payer: Self-pay | Admitting: Internal Medicine

## 2022-07-05 ENCOUNTER — Ambulatory Visit (HOSPITAL_COMMUNITY)
Admission: RE | Admit: 2022-07-05 | Discharge: 2022-07-05 | Disposition: A | Discharge status: Home / Self Care | Payer: Medicare (Managed Care) | Source: Ambulatory Visit | Attending: Internal Medicine | Admitting: Internal Medicine

## 2022-07-05 DIAGNOSIS — Z09 Encounter for follow-up examination after completed treatment for conditions other than malignant neoplasm: Secondary | ICD-10-CM | POA: Diagnosis not present

## 2022-07-05 DIAGNOSIS — Q438 Other specified congenital malformations of intestine: Secondary | ICD-10-CM | POA: Diagnosis not present

## 2022-07-05 DIAGNOSIS — Z86010 Personal history of colonic polyps: Secondary | ICD-10-CM | POA: Diagnosis not present

## 2022-07-05 DIAGNOSIS — K635 Polyp of colon: Secondary | ICD-10-CM

## 2022-07-05 DIAGNOSIS — D128 Benign neoplasm of rectum: Secondary | ICD-10-CM

## 2022-07-05 DIAGNOSIS — D124 Benign neoplasm of descending colon: Secondary | ICD-10-CM | POA: Insufficient documentation

## 2022-07-05 DIAGNOSIS — Z1211 Encounter for screening for malignant neoplasm of colon: Secondary | ICD-10-CM | POA: Diagnosis present

## 2022-07-05 DIAGNOSIS — G4733 Obstructive sleep apnea (adult) (pediatric): Secondary | ICD-10-CM | POA: Diagnosis not present

## 2022-07-05 DIAGNOSIS — F17210 Nicotine dependence, cigarettes, uncomplicated: Secondary | ICD-10-CM | POA: Diagnosis not present

## 2022-07-05 DIAGNOSIS — K621 Rectal polyp: Secondary | ICD-10-CM | POA: Diagnosis not present

## 2022-07-05 HISTORY — PX: COLONOSCOPY WITH PROPOFOL: SHX5780

## 2022-07-05 HISTORY — PX: POLYPECTOMY: SHX149

## 2022-07-05 SURGERY — COLONOSCOPY WITH PROPOFOL
Anesthesia: General

## 2022-07-05 MED ORDER — PROPOFOL 500 MG/50ML IV EMUL
INTRAVENOUS | Status: DC | PRN
  Administered 2022-07-05: 150 ug/kg/min via INTRAVENOUS

## 2022-07-05 MED ORDER — LACTATED RINGERS IV SOLN: INTRAVENOUS | Status: DC | PRN

## 2022-07-05 MED ORDER — PROPOFOL 10 MG/ML IV BOLUS
INTRAVENOUS | Status: DC | PRN
  Administered 2022-07-05 (×3): 20 mg via INTRAVENOUS
  Administered 2022-07-05: 40 mg via INTRAVENOUS

## 2022-07-05 MED ORDER — STERILE WATER FOR IRRIGATION IR SOLN
Status: DC | PRN
  Administered 2022-07-05: 60 mL

## 2022-07-05 NOTE — Anesthesia Postprocedure Evaluation (Signed)
Anesthesia Post Note  Patient: Roberto Hanson  Procedure(s) Performed: COLONOSCOPY WITH PROPOFOL POLYPECTOMY INTESTINAL  Patient location during evaluation: Endoscopy Anesthesia Type: General Level of consciousness: awake and alert Pain management: pain level controlled Vital Signs Assessment: post-procedure vital signs reviewed and stable Respiratory status: spontaneous breathing, nonlabored ventilation, respiratory function stable and patient connected to nasal cannula oxygen Cardiovascular status: blood pressure returned to baseline and stable Postop Assessment: no apparent nausea or vomiting Anesthetic complications: no   There were no known notable events for this encounter.   Last Vitals:  Vitals:   07/05/22 0945 07/05/22 1217  BP: 123/71 103/62  Pulse: 63 69  Resp: 12 17  Temp:    SpO2: 95% 100%    Last Pain:  Vitals:   07/05/22 1217  TempSrc: Oral  PainSc: 0-No pain                 Trixie Rude

## 2022-07-05 NOTE — Transfer of Care (Signed)
Immediate Anesthesia Transfer of Care Note  Patient: Roberto Hanson  Procedure(s) Performed: COLONOSCOPY WITH PROPOFOL POLYPECTOMY INTESTINAL  Patient Location: Endoscopy Unit  Anesthesia Type:MAC  Level of Consciousness: awake, alert , and oriented  Airway & Oxygen Therapy: Patient Spontanous Breathing and Patient connected to nasal cannula oxygen  Post-op Assessment: Report given to RN and Post -op Vital signs reviewed and stable  Post vital signs: Reviewed and stable  Last Vitals:  Vitals Value Taken Time  BP 103/62 07/05/22 1217  Temp    Pulse 69 07/05/22 1217  Resp 17 07/05/22 1217  SpO2 100 % 07/05/22 1217    Last Pain:  Vitals:   07/05/22 1217  TempSrc: Oral  PainSc: 0-No pain      Patients Stated Pain Goal: 8 (85/92/92 4462)  Complications: No notable events documented.

## 2022-07-05 NOTE — Op Note (Signed)
[facility] Patient Name: Roberto Hanson Procedure Date: 07/05/2022 11:38 AM MRN: [id number] Date of Birth: December 30, 1965 Attending MD: Norvel Richards , MD, [id number] CSN: [id number] Age: 57 Admit Type: Outpatient Procedure:                Colonoscopy Indications:              High risk colon cancer surveillance: Personal                            history of colonic polyps Providers:                Norvel Richards, MD, Crystal Page, Raphael Gibney Tech., Technician Referring MD:              Medicines:                Propofol per Anesthesia Complications:            No immediate complications. Estimated Blood Loss:     Estimated blood loss was minimal. Procedure:                Pre-Anesthesia Assessment:                           - Prior to the procedure, a History and Physical                            was performed, and patient medications and                            allergies were reviewed. The patient's tolerance of                            previous anesthesia was also reviewed. The risks                            and benefits of the procedure and the sedation                            options and risks were discussed with the patient.                            All questions were answered, and informed consent                            was obtained. Prior Anticoagulants: The patient has                            taken no anticoagulant or antiplatelet agents. ASA                            Grade Assessment: III - A patient with severe  systemic disease. After reviewing the risks and                            benefits, the patient was deemed in satisfactory                            condition to undergo the procedure.                           After obtaining informed consent, the colonoscope                            was passed under direct vision. Throughout the                             procedure, the patient's blood pressure, pulse, and                            oxygen saturations were monitored continuously. The                            [id number]) scope was introduced through the                            anus and advanced to the the cecum, identified by                            appendiceal orifice and ileocecal valve. The                            colonoscopy was performed without difficulty. The                            patient tolerated the procedure well. The quality                            of the bowel preparation was adequate. The                            ileocecal valve, appendiceal orifice, and rectum                            were photographed. The colonoscopy was performed                            without difficulty. Scope In: 11:52:26 AM Scope Out: 12:13:43 PM Scope Withdrawal Time: 0 hours 15 minutes 3 seconds  Total Procedure Duration: 0 hours 21 minutes 17 seconds  Findings:      The perianal and digital rectal examinations were normal. Redundant       colon requiring external abdominal pressure to reach the cecum.      Two sessile polyps were found in the rectum, distal rectum, descending       colon and mid descending colon. The polyps were 4 to 6 mm in size. These  polyps were removed with a cold snare. Resection and retrieval were       complete. Estimated blood loss was minimal.      The exam was otherwise without abnormality on direct and retroflexion       views. Impression:               - Two 4 to 6 mm polyps in the rectum, in the distal                            rectum, in the descending colon and in the mid                            descending colon, removed with a cold snare.                            Resected and retrieved. Redundant colon.                           - The examination was otherwise normal on direct                            and retroflexion views. Moderate Sedation:      Moderate  (conscious) sedation was personally administered by an       anesthesia professional. The following parameters were monitored: oxygen       saturation, heart rate, blood pressure, respiratory rate, EKG, adequacy       of pulmonary ventilation, and response to care. Recommendation:           - Patient has a contact number available for                            emergencies. The signs and symptoms of potential                            delayed complications were discussed with the                            patient. Return to normal activities tomorrow.                            Written discharge instructions were provided to the                            patient.                           - Resume previous diet.                           - Continue present medications.                           - Repeat colonoscopy date to be determined after                            pending  pathology results are reviewed for                            surveillance.                           - Return to GI office (date not yet determined). Procedure Code(s):        --- Professional ---                           [phone], Colonoscopy, flexible; with removal of                            tumor(s), polyp(s), or other lesion(s) by snare                            technique Diagnosis Code(s):        --- Professional ---                           Z86.010, Personal history of colonic polyps                           D12.8, Benign neoplasm of rectum                           D12.4, Benign neoplasm of descending colon CPT copyright 2022 American Medical Association. All rights reserved. The codes documented in this report are preliminary and upon coder review may  be revised to meet current compliance requirements. Roberto Hanson. Treyven Lafauci, MD Norvel Richards, MD 07/05/2022 12:18:23 PM This report has been signed electronically. Number of Addenda: 0

## 2022-07-05 NOTE — H&P (Signed)
$'@LOGO'z$ @   Primary Care Physician:  Gwenlyn Perking, FNP Primary Gastroenterologist:  Dr. Gala Romney  Pre-Procedure History & Physical: HPI:  Roberto Hanson is a 57 y.o. male here for    Surveillance colonoscopy.  History of multiple colonic adenomas removed 2018.   no bowel symptoms at this time.  Past Medical History:  Diagnosis Date   Chronic back pain    Chronic neck pain    Eye disorder    OSA (obstructive sleep apnea)    cpap    Past Surgical History:  Procedure Laterality Date   COLONOSCOPY WITH PROPOFOL N/A 05/22/2017   Procedure: COLONOSCOPY WITH PROPOFOL;  Surgeon: [name], MD;  Location: [facility];  Service: Endoscopy;  Laterality: N/A;  12:00pm   EYE SURGERY     POLYPECTOMY  05/22/2017   Procedure: POLYPECTOMY;  Surgeon: [name], MD;  Location: [facility];  Service: Endoscopy;;  colon    Prior to Admission medications   Medication Sig Start Date End Date Taking? Authorizing Provider  Ascorbic Acid (VITAMIN C) 1000 MG tablet Take 1,000 mg by mouth every other day.   Yes [provider]  clotrimazole-betamethasone (LOTRISONE) cream Apply 1 Application topically 2 (two) times daily as needed (rash).   Yes [provider]  Na Sulfate-K Sulfate-Mg Sulf 17.5-3.13-1.6 GM/177ML SOLN As directed 05/31/22  Yes Krupa Stege, Cristopher Estimable, MD  naproxen (NAPROSYN) 500 MG tablet TAKE 1 TABLET (500 MG TOTAL) BY MOUTH 2 (TWO) TIMES DAILY WITH A MEAL. Patient taking differently: Take 500 mg by mouth 2 (two) times daily as needed for moderate pain. 05/22/21  Yes Hendricks Limes F, FNP  pseudoephedrine-guaifenesin (MUCINEX D) 60-600 MG 12 hr tablet Take 1 tablet by mouth 2 (two) times daily as needed for congestion.   Yes [provider]    Allergies as of 05/31/2022   (No Known Allergies)    Family History  Problem Relation Age of Onset   Diabetes Mother    Diabetes Father    Diabetes Brother    Glaucoma Sister    Healthy Daughter     Glaucoma Brother    Diabetes Brother    Glaucoma Sister    Colon cancer Neg Hx     Social History   Socioeconomic History   Marital status: Single    Spouse name: Not on file   Number of children: 1   Years of education: Not on file   Highest education level: 11th grade  Occupational History   Occupation: employed  Tobacco Use   Smoking status: Some Days    Packs/day: 0.25    Years: 34.00    Total pack years: 8.50    Types: Cigarettes    Last attempt to quit: 04/19/2015    Years since quitting: 7.2   Smokeless tobacco: Never  Vaping Use   Vaping Use: Never used  Substance and Sexual Activity   Alcohol use: No    Comment: rare   Drug use: No   Sexual activity: Not Currently  Other Topics Concern   Not on file  Social History Narrative   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   Girlfriend assists with transportation, cooking, etc   Social Determinants of Health   Financial Resource Strain: Low Risk  (03/03/2021)   Overall Financial Resource Strain (CARDIA)    Difficulty of Paying Living Expenses: Not hard at all  Food Insecurity: No Food Insecurity (03/03/2021)   Hunger Vital Sign    Worried About Running Out  of Food in the Last Year: Never true    [place] in the Last Year: Never true  Transportation Needs: No Transportation Needs (03/03/2021)   PRAPARE - Hydrologist (Medical): No    Lack of Transportation (Non-Medical): No  Physical Activity: Insufficiently Active (03/03/2021)   Exercise Vital Sign    Days of Exercise per Week: 7 days    Minutes of Exercise per Session: 10 min  Stress: No Stress Concern Present (03/03/2021)   [place]    Feeling of Stress : Only a little  Social Connections: Moderately Integrated (03/03/2021)   Social Connection and Isolation Panel [NHANES]    Frequency of Communication with Friends and Family: More than three times a week    Frequency of  Social Gatherings with Friends and Family: More than three times a week    Attends Religious Services: More than 4 times per year    Active Member of [employer] or Organizations: Yes    Attends Archivist Meetings: More than 4 times per year    Marital Status: Separated  Intimate Partner Violence: Not At Risk (03/03/2021)   Humiliation, Afraid, Rape, and Kick questionnaire    Fear of Current or Ex-Partner: No    Emotionally Abused: No    Physically Abused: No    Sexually Abused: No    Review of Systems: See HPI, otherwise negative ROS  Physical Exam: BP 123/71   Pulse 63   Temp 98.1 F (36.7 C)   Resp 12   Ht '5\' 8"'$  (1.727 m)   Wt 104.3 kg   SpO2 95%   BMI 34.97 kg/m  General:   Alert,  Well-developed, well-nourished, pleasant and cooperative in NAD Neck:  Supple; no masses or thyromegaly. No significant cervical adenopathy. Lungs:  Clear throughout to auscultation.   No wheezes, crackles, or rhonchi. No acute distress. Heart:  Regular rate and rhythm; no murmurs, clicks, rubs,  or gallops. Abdomen: Non-distended, normal bowel sounds.  Soft and nontender without appreciable mass or hepatosplenomegaly.  Pulses:  Normal pulses noted. Extremities:  Without clubbing or edema.  Impression/Plan:    57 year old gentleman with a history of multiple adenomas.  Here for a surveillance colonoscopy.The risks, benefits, limitations, alternatives and imponderables have been reviewed with the patient. Questions have been answered. All parties are agreeable.       Notice: This dictation was prepared with Dragon dictation along with smaller phrase technology. Any transcriptional errors that result from this process are unintentional and may not be corrected upon review.

## 2022-07-05 NOTE — Discharge Instructions (Signed)
  Colonoscopy Discharge Instructions  Read the instructions outlined below and refer to this sheet in the next few weeks. These discharge instructions provide you with general information on caring for yourself after you leave the hospital. Your doctor may also give you specific instructions. While your treatment has been planned according to the most current medical practices available, unavoidable complications occasionally occur. If you have any problems or questions after discharge, call Dr. Gala Romney at [phone]. ACTIVITY You may resume your regular activity, but move at a slower pace for the next 24 hours.  Take frequent rest periods for the next 24 hours.  Walking will help get rid of the air and reduce the bloated feeling in your belly (abdomen).  No driving for 24 hours (because of the medicine (anesthesia) used during the test).   Do not sign any important legal documents or operate any machinery for 24 hours (because of the anesthesia used during the test).  NUTRITION Drink plenty of fluids.  You may resume your normal diet as instructed by your doctor.  Begin with a light meal and progress to your normal diet. Heavy or fried foods are harder to digest and may make you feel sick to your stomach (nauseated).  Avoid alcoholic beverages for 24 hours or as instructed.  MEDICATIONS You may resume your normal medications unless your doctor tells you otherwise.  WHAT YOU CAN EXPECT TODAY Some feelings of bloating in the abdomen.  Passage of more gas than usual.  Spotting of blood in your stool or on the toilet paper.  IF YOU HAD POLYPS REMOVED DURING THE COLONOSCOPY: No aspirin products for 7 days or as instructed.  No alcohol for 7 days or as instructed.  Eat a soft diet for the next 24 hours.  FINDING OUT THE RESULTS OF YOUR TEST Not all test results are available during your visit. If your test results are not back during the visit, make an appointment with your caregiver to find out the  results. Do not assume everything is normal if you have not heard from your caregiver or the medical facility. It is important for you to follow up on all of your test results.  SEEK IMMEDIATE MEDICAL ATTENTION IF: You have more than a spotting of blood in your stool.  Your belly is swollen (abdominal distention).  You are nauseated or vomiting.  You have a temperature over 101.  You have abdominal pain or discomfort that is severe or gets worse throughout the day.      2 polyps removed from your colon today  Further recommendations to follow pending review of pathology report  At patient request, I called Dellie Burns at [phone] findings and recommendations

## 2022-07-05 NOTE — Anesthesia Preprocedure Evaluation (Signed)
Anesthesia Evaluation  Patient identified by MRN, date of birth, ID band Patient awake  General Assessment Comment:Seeing impaired  Reviewed: Allergy & Precautions, NPO status , Patient's Chart, lab work & pertinent test results, reviewed documented beta blocker date and time   Airway Mallampati: II  TM Distance: >3 FB Neck ROM: Full   Comment: Thick neck Dental no notable dental hx. (+) Teeth Intact   Pulmonary sleep apnea , Current Smoker and Patient abstained from smoking., former smoker   Pulmonary exam normal        Cardiovascular Exercise Tolerance: Good negative cardio ROS Normal cardiovascular exam Rhythm:Regular Rate:Normal  Normal sinus rhythm Normal ECG  (Nov 2018)   Neuro/Psych  PSYCHIATRIC DISORDERS Anxiety     Vision issues    GI/Hepatic negative GI ROS, Neg liver ROS,,,  Endo/Other  negative endocrine ROS    Renal/GU negative Renal ROS     Musculoskeletal negative musculoskeletal ROS (+)    Abdominal Normal abdominal exam  (+)   Peds  Hematology negative hematology ROS (+)   Anesthesia Other Findings   Reproductive/Obstetrics                             Anesthesia Physical Anesthesia Plan  ASA: 2  Anesthesia Plan: General   Post-op Pain Management: Minimal or no pain anticipated   Induction: Intravenous  PONV Risk Score and Plan: Propofol infusion and TIVA  Airway Management Planned: Nasal Cannula and Natural Airway  Additional Equipment:   Intra-op Plan:   Post-operative Plan:   Informed Consent: I have reviewed the patients History and Physical, chart, labs and discussed the procedure including the risks, benefits and alternatives for the proposed anesthesia with the patient or authorized representative who has indicated his/her understanding and acceptance.       Plan Discussed with: CRNA  Anesthesia Plan Comments:         Anesthesia Quick  Evaluation

## 2022-07-06 ENCOUNTER — Ambulatory Visit (INDEPENDENT_AMBULATORY_CARE_PROVIDER_SITE_OTHER): Payer: Medicare (Managed Care) | Admitting: Family Medicine

## 2022-07-06 ENCOUNTER — Encounter: Payer: Self-pay | Admitting: Family Medicine

## 2022-07-06 ENCOUNTER — Encounter: Payer: Self-pay | Admitting: Internal Medicine

## 2022-07-06 VITALS — BP 96/55 | HR 63 | Temp 98.0°F | Ht 68.0 in | Wt 240.2 lb

## 2022-07-06 DIAGNOSIS — I959 Hypotension, unspecified: Secondary | ICD-10-CM

## 2022-07-06 DIAGNOSIS — H6192 Disorder of left external ear, unspecified: Secondary | ICD-10-CM

## 2022-07-06 LAB — SURGICAL PATHOLOGY

## 2022-07-06 NOTE — Progress Notes (Signed)
   Acute Office Visit  Subjective:     Patient ID: Roberto Hanson, male    DOB: 1966/01/15, 57 y.o.   MRN: [id number]  Chief Complaint  Patient presents with   Skin Tag    HPI Patient is in today for a lesion on her left ear lobe. He isn't sure how long this has been present, but knows that it has gotten larger over the last few months. He denies exudate, tenderness, pain, warmth, or itching. He isn't sure if it has changed color.   He had a colonoscopy yesterday. His BP is a little soft today. He denies increased thirst, palpitations, dizziness, lightheadedness, or weakness.   ROS As per HPI.      Objective:    BP (!) 96/55   Pulse 63   Temp 98 F (36.7 C) (Temporal)   Ht '5\' 8"'$  (1.727 m)   Wt 240 lb 4 oz (109 kg)   SpO2 94%   BMI 36.53 kg/m  BP Readings from Last 3 Encounters:  07/06/22 (!) 96/55  07/05/22 103/62  04/06/22 112/64      Physical Exam Vitals and nursing note reviewed.  Constitutional:      General: He is not in acute distress.    Appearance: He is not ill-appearing, toxic-appearing or diaphoretic.  HENT:     Head:     Comments: 0.5 raised flesh colored lesion of left ear lobe with dark pigmented edges.  Cardiovascular:     Rate and Rhythm: Normal rate and regular rhythm.     Heart sounds: Normal heart sounds. No murmur heard. Pulmonary:     Effort: Pulmonary effort is normal. No respiratory distress.     Breath sounds: Normal breath sounds.  Musculoskeletal:     Right lower leg: No edema.     Left lower leg: No edema.  Skin:    General: Skin is warm.  Neurological:     General: No focal deficit present.     Mental Status: He is alert and oriented to person, place, and time.  Psychiatric:        Mood and Affect: Mood normal.        Behavior: Behavior normal.     No results found for any visits on 07/06/22.      Assessment & Plan:   Cline was seen today for skin tag.  Diagnoses and all orders for this visit:  Lesion  of skin of left ear Raised with dark pigmented edges. Discussed referral to dermatology for likely removal and biopsy.  -     Ambulatory referral to Dermatology  Hypotension, unspecified hypotension type Asymptomatic, likely due to colonoscopy prep. Discussed hydration.    Return if symptoms worsen or fail to improve, for schedule CPE.  The patient indicates understanding of these issues and agrees with the plan.   Gwenlyn Perking, FNP

## 2022-07-10 ENCOUNTER — Encounter (HOSPITAL_COMMUNITY): Payer: Self-pay | Admitting: Internal Medicine

## 2022-08-17 ENCOUNTER — Telehealth: Payer: Self-pay | Admitting: Pulmonary Disease

## 2022-08-17 NOTE — Telephone Encounter (Signed)
Made a call to every call regarding a faxed request  Urgent action needed: Information needed in order to approve request for member Roberto Hanson  Spoke with the representative who stated she could not find anything on their end that's pending

## 2022-08-30 NOTE — Telephone Encounter (Signed)
If the patient is needing Cpap supplies he was last seen 10/02/21 and will need to have annual follow up appt

## 2023-03-15 ENCOUNTER — Ambulatory Visit: Payer: Medicare (Managed Care) | Admitting: Pulmonary Disease

## 2023-03-15 ENCOUNTER — Ambulatory Visit (INDEPENDENT_AMBULATORY_CARE_PROVIDER_SITE_OTHER): Payer: Medicare (Managed Care)

## 2023-03-15 VITALS — Ht 68.0 in | Wt 240.0 lb

## 2023-03-15 DIAGNOSIS — Z0000 Encounter for general adult medical examination without abnormal findings: Secondary | ICD-10-CM | POA: Diagnosis not present

## 2023-03-15 NOTE — Patient Instructions (Signed)
Roberto Hanson , Thank you for taking time to come for your Medicare Wellness Visit. I appreciate your ongoing commitment to your health goals. Please review the following plan we discussed and let me know if I can assist you in the future.   Referrals/Orders/Follow-Ups/Clinician Recommendations: Aim for 30 minutes of exercise or brisk walking, 6-8 glasses of water, and 5 servings of fruits and vegetables each day.   This is a list of the screening recommended for you and due dates:  Health Maintenance  Topic Date Due   Flu Shot  Never done   Hepatitis C Screening  07/07/2023*   HIV Screening  07/07/2023*   COVID-19 Vaccine (1) 07/23/2023*   Zoster (Shingles) Vaccine (1 of 2) 10/05/2023*   Medicare Annual Wellness Visit  03/14/2024   DTaP/Tdap/Td vaccine (2 - Td or Tdap) 11/17/2026   Colon Cancer Screening  07/05/2032   HPV Vaccine  Aged Out  *Topic was postponed. The date shown is not the original due date.    Advanced directives: (Copy Requested) Please bring a copy of your health care power of attorney and living will to the office to be added to your chart at your convenience.  Next Medicare Annual Wellness Visit scheduled for next year: Yes Insert preventive care Attachment reference

## 2023-03-15 NOTE — Progress Notes (Signed)
Subjective:   Roberto Hanson is a 57 y.o. male who presents for Medicare Annual/Subsequent preventive examination.  Visit Complete: Virtual  I connected with  Roberto Hanson on 03/15/23 by a audio enabled telemedicine application and verified that I am speaking with the correct person using two identifiers.  Patient Location: Home  Provider Location: Home Office  I discussed the limitations of evaluation and management by telemedicine. The patient expressed understanding and agreed to proceed.  Patient Medicare AWV questionnaire was completed by the patient on 03/15/2023; I have confirmed that all information answered by patient is correct and no changes since this date.  Cardiac Risk Factors include: advanced age (>29men, >23 women);male genderBecause this visit was a virtual/telehealth visit, some criteria may be missing or patient reported. Any vitals not documented were not able to be obtained and vitals that have been documented are patient reported.       Objective:    Today's Vitals   03/15/23 0801  Weight: 240 lb (108.9 kg)  Height: 5\' 8"  (1.727 m)   Body mass index is 36.49 kg/m.     03/15/2023    8:04 AM 07/05/2022    9:34 AM 03/06/2022    9:18 AM 03/03/2021    9:14 AM 10/23/2019    2:12 PM 05/16/2017   10:16 AM 04/23/2017    1:28 PM  Advanced Directives  Does Patient Have a Medical Advance Directive? No No No No No No No  Would patient like information on creating a medical advance directive? Yes (MAU/Ambulatory/Procedural Areas - Information given) Yes (MAU/Ambulatory/Procedural Areas - Information given) No - Patient declined No - Patient declined No - Patient declined No - Patient declined     Current Medications (verified) Outpatient Encounter Medications as of 03/15/2023  Medication Sig   Ascorbic Acid (VITAMIN C) 1000 MG tablet Take 1,000 mg by mouth every other day.   clotrimazole-betamethasone (LOTRISONE) cream Apply 1 Application topically 2  (two) times daily as needed (rash).   naproxen (NAPROSYN) 500 MG tablet TAKE 1 TABLET (500 MG TOTAL) BY MOUTH 2 (TWO) TIMES DAILY WITH A MEAL. (Patient taking differently: Take 500 mg by mouth 2 (two) times daily as needed for moderate pain.)   pseudoephedrine-guaifenesin (MUCINEX D) 60-600 MG 12 hr tablet Take 1 tablet by mouth 2 (two) times daily as needed for congestion.   No facility-administered encounter medications on file as of 03/15/2023.    Allergies (verified) Patient has no known allergies.   History: Past Medical History:  Diagnosis Date   Chronic back pain    Chronic neck pain    Eye disorder    OSA (obstructive sleep apnea)    cpap   Past Surgical History:  Procedure Laterality Date   COLONOSCOPY WITH PROPOFOL N/A 05/22/2017   Procedure: COLONOSCOPY WITH PROPOFOL;  Surgeon: Corbin Ade, MD;  [facility];  Service: Endoscopy;  Laterality: N/A;  12:00pm   COLONOSCOPY WITH PROPOFOL N/A 07/05/2022   Procedure: COLONOSCOPY WITH PROPOFOL;  Surgeon: Corbin Ade, MD;  [facility];  Service: Endoscopy;  Laterality: N/A;  11:00 AM   EYE SURGERY     POLYPECTOMY  05/22/2017   Procedure: POLYPECTOMY;  Surgeon: Corbin Ade, MD;  [facility];  Service: Endoscopy;;  colon   POLYPECTOMY  07/05/2022   Procedure: POLYPECTOMY INTESTINAL;  Surgeon: Corbin Ade, MD;  [facility];  Service: Endoscopy;;   Family History  Problem Relation Age of Onset  Diabetes Mother    Diabetes Father    Diabetes Brother    Glaucoma Sister    Healthy Daughter    Glaucoma Brother    Diabetes Brother    Glaucoma Sister    Colon cancer Neg Hx    Social History   Socioeconomic History   Marital status: Single    Spouse name: Not on file   Number of children: 1   Years of education: Not on file   Highest education level: 11th grade  Occupational History   Occupation: employed  Tobacco Use   Smoking status: Some Days    Current  packs/day: 0.00    Average packs/day: 0.3 packs/day for 34.0 years (8.5 ttl pk-yrs)    Types: Cigarettes    Start date: 04/18/1981    Last attempt to quit: 04/19/2015    Years since quitting: 7.9   Smokeless tobacco: Never  Vaping Use   Vaping status: Never Used  Substance and Sexual Activity   Alcohol use: No    Comment: rare   Drug use: No   Sexual activity: Not Currently  Other Topics Concern   Not on file  Social History Narrative   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   Girlfriend assists with transportation, cooking, etc   Social Determinants of Health   Financial Resource Strain: Low Risk  (03/15/2023)   Overall Financial Resource Strain (CARDIA)    Difficulty of Paying Living Expenses: Not hard at all  Food Insecurity: No Food Insecurity (03/15/2023)   Hunger Vital Sign    Worried About Running Out of Food in the Last Year: Never true    Ran Out of Food in the Last Year: Never true  Transportation Needs: No Transportation Needs (03/15/2023)   PRAPARE - Administrator, Civil Service (Medical): No    Lack of Transportation (Non-Medical): No  Physical Activity: Inactive (03/15/2023)   Exercise Vital Sign    Days of Exercise per Week: 0 days    Minutes of Exercise per Session: 0 min  Stress: No Stress Concern Present (03/15/2023)   [employer] of Occupational Health - Occupational Stress Questionnaire    Feeling of Stress : Not at all  Social Connections: Moderately Isolated (03/15/2023)   Social Connection and Isolation Panel [NHANES]    Frequency of Communication with Friends and Family: More than three times a week    Frequency of Social Gatherings with Friends and Family: More than three times a week    Attends Religious Services: 1 to 4 times per year    Active Member of [employer] or Organizations: No    Attends Engineer, structural: Never    Marital Status: Divorced    Tobacco Counseling Ready to quit: Not Answered Counseling given: Not  Answered   Clinical Intake:  Pre-visit preparation completed: Yes  Pain : No/denies pain     Nutritional Risks: None Diabetes: No  How often do you need to have someone help you when you read instructions, pamphlets, or other written materials from your doctor or pharmacy?: 1 - Never  Interpreter Needed?: No  Information entered by :: Renie Ora, LPN   Activities of Daily Living    03/15/2023    8:05 AM  In your present state of health, do you have any difficulty performing the following activities:  Hearing? 0  Vision? 0  Difficulty concentrating or making decisions? 0  Walking or climbing stairs? 0  Dressing or bathing? 0  Doing errands, shopping? 0  Preparing Food and eating ? N  Using the Toilet? N  In the past six months, have you accidently leaked urine? N  Do you have problems with loss of bowel control? N  Managing your Medications? N  Managing your Finances? N  Housekeeping or managing your Housekeeping? N    Patient Care Team: Gabriel Earing, FNP as PCP - General (Family Medicine) Jena Gauss Gerrit Friends, MD as Consulting Physician (Gastroenterology)  Indicate any recent Medical Services you may have received from other than [facility] providers in the past year (date may be approximate).     Assessment:   This is a routine wellness examination for [place].  Hearing/Vision screen Vision Screening - Comments:: Wears rx glasses - up to date with routine eye exams with  Dr.Vann   Goals Addressed             This Visit's Progress    Have 3 meals a day   On track               Depression Screen    03/15/2023    8:03 AM 07/06/2022   10:50 AM 03/06/2022    9:23 AM 06/09/2021    3:36 PM 03/09/2021    2:31 PM 03/03/2021    9:36 AM 12/11/2019    3:48 PM  PHQ 2/9 Scores  PHQ - 2 Score 0 0 0 0 0 0 0  PHQ- 9 Score  0   0  0    Fall Risk    03/15/2023    8:02 AM 07/06/2022   10:50 AM 04/06/2022   12:12 PM 03/06/2022    9:22 AM 06/09/2021    3:36 PM   Fall Risk   Falls in the past year? 0 0 0  0  Number falls in past yr: 0      Injury with Fall? 0      Risk for fall due to : No Fall Risks      Follow up Falls prevention discussed   Falls evaluation completed     MEDICARE RISK AT HOME: Medicare Risk at Home Any stairs in or around the home?: No If so, are there any without handrails?: No Home free of loose throw rugs in walkways, pet beds, electrical cords, etc?: Yes Adequate lighting in your home to reduce risk of falls?: Yes Life alert?: No Use of a cane, walker or w/c?: No Grab bars in the bathroom?: Yes Shower chair or bench in shower?: Yes Elevated toilet seat or a handicapped toilet?: Yes  TIMED UP AND GO:  Was the test performed?  No    Cognitive Function:    11/16/2016    4:05 PM  MMSE - Mini Mental State Exam  Orientation to time 5  Orientation to Place 5  Registration 3  Attention/ Calculation 5  Recall 3  Language- name 2 objects 2  Language- repeat 1  Language- follow 3 step command 3  Language- read & follow direction 1  Write a sentence --  Write a sentence-comments unable to complete due to visual limitations  Copy design --  Copy design-comments unable to complete due to visual limitations        03/15/2023    8:05 AM 03/06/2022    9:20 AM 10/23/2019    2:14 PM 10/17/2018    2:39 PM  6CIT Screen  What Year? 0 points 0 points 0 points 0 points  What month? 0 points 0 points 0 points 0 points  What time?  0 points 0 points 0 points 0 points  Count back from 20 0 points 0 points 0 points 0 points  Months in reverse 0 points 0 points 0 points 0 points  Repeat phrase 0 points 0 points 0 points 0 points  Total Score 0 points 0 points 0 points 0 points    Immunizations Immunization History  Administered Date(s) Administered   Tdap 11/16/2016    TDAP status: Up to date  Flu Vaccine status: Declined, Education has been provided regarding the importance of this vaccine but patient still declined.  Advised may receive this vaccine at local pharmacy or Health Dept. Aware to provide a copy of the vaccination record if obtained from local pharmacy or Health Dept. Verbalized acceptance and understanding.  Pneumococcal vaccine status: Declined,  Education has been provided regarding the importance of this vaccine but patient still declined. Advised may receive this vaccine at local pharmacy or Health Dept. Aware to provide a copy of the vaccination record if obtained from local pharmacy or Health Dept. Verbalized acceptance and understanding.   Covid-19 vaccine status: Declined, Education has been provided regarding the importance of this vaccine but patient still declined. Advised may receive this vaccine at local pharmacy or Health Dept.or vaccine clinic. Aware to provide a copy of the vaccination record if obtained from local pharmacy or Health Dept. Verbalized acceptance and understanding.  Qualifies for Shingles Vaccine? Yes   Zostavax completed No   Shingrix Completed?: No.    Education has been provided regarding the importance of this vaccine. Patient has been advised to call insurance company to determine out of pocket expense if they have not yet received this vaccine. Advised may also receive vaccine at local pharmacy or Health Dept. Verbalized acceptance and understanding.  Screening Tests Health Maintenance  Topic Date Due   INFLUENZA VACCINE  Never done   Hepatitis C Screening  07/07/2023 (Originally 04/14/1984)   HIV Screening  07/07/2023 (Originally 04/14/1981)   COVID-19 Vaccine (1) 07/23/2023 (Originally 04/15/1971)   Zoster Vaccines- Shingrix (1 of 2) 10/05/2023 (Originally 04/14/1985)   Medicare Annual Wellness (AWV)  03/14/2024   DTaP/Tdap/Td (2 - Td or Tdap) 11/17/2026   Colonoscopy  07/05/2032   HPV VACCINES  Aged Out    Health Maintenance  Health Maintenance Due  Topic Date Due   INFLUENZA VACCINE  Never done    Colorectal cancer screening: Type of screening:  Colonoscopy. Completed 07/05/2022. Repeat every 10 years  Lung Cancer Screening: (Low Dose CT Chest recommended if Age 70-80 years, 20 pack-year currently smoking OR have quit w/in 15years.) does not qualify.   Lung Cancer Screening Referral: n/a  Additional Screening:  Hepatitis C Screening: does not qualify;   Vision Screening: Recommended annual ophthalmology exams for early detection of glaucoma and other disorders of the eye. Is the patient up to date with their annual eye exam?  Yes  Who is the provider or what is the name of the office in which the patient attends annual eye exams? Dr.Vann If pt is not established with a provider, would they like to be referred to a provider to establish care? No .   Dental Screening: Recommended annual dental exams for proper oral hygiene   Community Resource Referral / Chronic Care Management: CRR required this visit?  No   CCM required this visit?  No     Plan:     I have personally reviewed and noted the following in the patient's chart:   Medical and social history Use  of alcohol, tobacco or illicit drugs  Current medications and supplements including opioid prescriptions. Patient is not currently taking opioid prescriptions. Functional ability and status Nutritional status Physical activity Advanced directives List of other physicians Hospitalizations, surgeries, and ER visits in previous 12 months Vitals Screenings to include cognitive, depression, and falls Referrals and appointments  In addition, I have reviewed and discussed with patient certain preventive protocols, quality metrics, and best practice recommendations. A written personalized care plan for preventive services as well as general preventive health recommendations were provided to patient.     Lorrene Reid, LPN   1/61/0960   After Visit Summary: (MyChart) Due to this being a telephonic visit, the after visit summary with patients personalized plan was  offered to patient via MyChart   Nurse Notes: none

## 2023-06-14 ENCOUNTER — Ambulatory Visit: Payer: Medicare (Managed Care) | Admitting: Pulmonary Disease

## 2023-08-16 ENCOUNTER — Ambulatory Visit: Payer: Medicare (Managed Care) | Admitting: Pulmonary Disease

## 2023-10-17 ENCOUNTER — Ambulatory Visit: Payer: Medicare (Managed Care) | Admitting: Pulmonary Disease

## 2023-10-17 ENCOUNTER — Encounter: Payer: Self-pay | Admitting: Pulmonary Disease

## 2023-10-17 VITALS — BP 101/66 | HR 71 | Ht 68.0 in | Wt 228.2 lb

## 2023-10-17 DIAGNOSIS — G4733 Obstructive sleep apnea (adult) (pediatric): Secondary | ICD-10-CM

## 2023-10-17 NOTE — Addendum Note (Signed)
 Addended by: Gaylon Kea on: 10/17/2023 03:55 PM   Modules accepted: Orders

## 2023-10-17 NOTE — Patient Instructions (Signed)
 DME referral for pressure change  Change machine pressures from AutoSet 5-20 to 5-14  Make sure you call us  with significant concerns if the pressure change is not tolerated  I will see you back in about 6 months

## 2023-10-17 NOTE — Progress Notes (Signed)
 Roberto Hanson    [id number]    Jul 24, 1965  Primary Care Physician:Morgan, Ted Favor, FNP  Referring Physician: Albertha Huger, FNP [address] [place],  [place] [address]  Chief complaint:   Patient with a history of obstructive sleep apnea In for follow-up today  HPI:  Has been doing well with the CPAP  Occasionally feels the machine pressure is too high  Wakes up feeling like his had a good nights rest Tolerating pressures well  Still snores without the machine on  history of anxiety, chronic back pain, sleep apnea for which he is compliant with CPAP  Wakes up feeling rested   Outpatient Encounter Medications as of 10/17/2023  Medication Sig   Ascorbic Acid (VITAMIN C) 1000 MG tablet Take 1,000 mg by mouth every other day.   clotrimazole-betamethasone (LOTRISONE) cream Apply 1 Application topically 2 (two) times daily as needed (rash).   naproxen  (NAPROSYN ) 500 MG tablet TAKE 1 TABLET (500 MG TOTAL) BY MOUTH 2 (TWO) TIMES DAILY WITH A MEAL. (Patient taking differently: Take 500 mg by mouth 2 (two) times daily as needed for moderate pain (pain score 4-6).)   pseudoephedrine-guaifenesin (MUCINEX D) 60-600 MG 12 hr tablet Take 1 tablet by mouth 2 (two) times daily as needed for congestion. As needed   No facility-administered encounter medications on file as of 10/17/2023.    Allergies as of 10/17/2023   (No Known Allergies)    Past Medical History:  Diagnosis Date   Chronic back pain    Chronic neck pain    Eye disorder    OSA (obstructive sleep apnea)    cpap    Past Surgical History:  Procedure Laterality Date   COLONOSCOPY WITH PROPOFOL  N/A 05/22/2017   Procedure: COLONOSCOPY WITH PROPOFOL ;  Surgeon: Roberto Espy, MD;  [facility];  Service: Endoscopy;  Laterality: N/A;  12:00pm   COLONOSCOPY WITH PROPOFOL  N/A 07/05/2022   Procedure: COLONOSCOPY WITH PROPOFOL ;  Surgeon: Roberto Espy, MD;  [facility];  Service:  Endoscopy;  Laterality: N/A;  11:00 AM   EYE SURGERY     POLYPECTOMY  05/22/2017   Procedure: POLYPECTOMY;  Surgeon: Roberto Espy, MD;  [facility];  Service: Endoscopy;;  colon   POLYPECTOMY  07/05/2022   Procedure: POLYPECTOMY INTESTINAL;  Surgeon: Roberto Espy, MD;  [facility];  Service: Endoscopy;;    Family History  Problem Relation Age of Onset   Diabetes Mother    Diabetes Father    Diabetes Brother    Glaucoma Sister    Healthy Daughter    Glaucoma Brother    Diabetes Brother    Glaucoma Sister    Colon cancer Neg Hx     Social History   Socioeconomic History   Marital status: Single    Spouse name: Not on file   Number of children: 1   Years of education: Not on file   Highest education level: 11th grade  Occupational History   Occupation: employed  Tobacco Use   Smoking status: Some Days    Current packs/day: 0.00    Average packs/day: 0.3 packs/day for 34.0 years (8.5 ttl pk-yrs)    Types: Cigarettes    Start date: 04/18/1981    Last attempt to quit: 04/19/2015    Years since quitting: 8.5   Smokeless tobacco: Never  Vaping Use   Vaping status: Never Used  Substance and Sexual Activity  Alcohol use: No    Comment: rare   Drug use: No   Sexual activity: Not Currently  Other Topics Concern   Not on file  Social History Narrative   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   Girlfriend assists with transportation, cooking, etc   Social Drivers of Health   Financial Resource Strain: Low Risk  (03/15/2023)   Overall Financial Resource Strain (CARDIA)    Difficulty of Paying Living Expenses: Not hard at all  Food Insecurity: No Food Insecurity (03/15/2023)   Hunger Vital Sign    Worried About Running Out of Food in the Last Year: Never true    Ran Out of Food in the Last Year: Never true  Transportation Needs: No Transportation Needs (03/15/2023)   PRAPARE - Administrator, Civil Service (Medical): No    Lack of  Transportation (Non-Medical): No  Physical Activity: Inactive (03/15/2023)   Exercise Vital Sign    Days of Exercise per Week: 0 days    Minutes of Exercise per Session: 0 min  Stress: No Stress Concern Present (03/15/2023)   [employer] of Occupational Health - Occupational Stress Questionnaire    Feeling of Stress : Not at all  Social Connections: Moderately Isolated (03/15/2023)   Social Connection and Isolation Panel [NHANES]    Frequency of Communication with Friends and Family: More than three times a week    Frequency of Social Gatherings with Friends and Family: More than three times a week    Attends Religious Services: 1 to 4 times per year    Active Member of [employer] or Organizations: No    Attends Banker Meetings: Never    Marital Status: Divorced  Catering manager Violence: Not At Risk (03/15/2023)   Humiliation, Afraid, Rape, and Kick questionnaire    Fear of Current or Ex-Partner: No    Emotionally Abused: No    Physically Abused: No    Sexually Abused: No    Review of Systems  Constitutional:  Negative for fatigue.  Respiratory:  Positive for apnea.   Psychiatric/Behavioral:  Positive for sleep disturbance.     Vitals:   10/17/23 1507  BP: 101/66  Pulse: 71  SpO2: 94%     Physical Exam Constitutional:      Appearance: He is obese.  HENT:     Head: Normocephalic.     Mouth/Throat:     Mouth: Mucous membranes are moist.     Comments: Mallampati 4, crowded oropharynx Eyes:     Pupils: Pupils are equal, round, and reactive to light.  Cardiovascular:     Rate and Rhythm: Normal rate and regular rhythm.     Heart sounds: No murmur heard.    No friction rub.  Pulmonary:     Effort: No respiratory distress.     Breath sounds: No stridor. No wheezing or rhonchi.  Musculoskeletal:     Cervical back: No rigidity or tenderness.  Neurological:     Mental Status: He is alert.  Psychiatric:        Mood and Affect: Mood normal.    Data  Reviewed: Download from the machine shows 100% compliance with CPAP Average use of 7 hours AutoSet 5-20, 95 percentile pressure 14.5 Residual AHI 1.6  Assessment:  History of obstructive sleep apnea - Well treated with CPAP therapy - Has sensation the machine pressures is too high  Class I obesity   Plan/Recommendations: Continue CPAP  Auto CPAP  I will see him in about 6  months  Will make pressure changes  DME referral for change of pressure from 5-15 to 5-14  Myer Artis MD [facility] 10/17/2023, 3:20 PM  CC: Roberto Huger, FNP

## 2023-11-30 ENCOUNTER — Ambulatory Visit
Admission: RE | Admit: 2023-11-30 | Discharge: 2023-11-30 | Discharge status: Home / Self Care | Payer: Medicare (Managed Care) | Source: Ambulatory Visit | Attending: Internal Medicine

## 2023-11-30 VITALS — BP 129/81 | HR 70 | Temp 98.5°F | Resp 20

## 2023-11-30 DIAGNOSIS — R21 Rash and other nonspecific skin eruption: Secondary | ICD-10-CM

## 2023-11-30 MED ORDER — DEXAMETHASONE SODIUM PHOSPHATE 10 MG/ML IJ SOLN
10.0000 mg | Freq: Once | INTRAMUSCULAR | Status: AC
  Administered 2023-11-30: 10 mg via INTRAMUSCULAR

## 2023-11-30 MED ORDER — TRIAMCINOLONE ACETONIDE 0.1 % EX CREA: 1.0000 | TOPICAL_CREAM | Freq: Two times a day (BID) | CUTANEOUS | 0 refills | Status: AC

## 2023-11-30 NOTE — ED Triage Notes (Signed)
 Pt c/o red  itchy rash on arms and back x 1 week.Using lotrisone cream

## 2023-11-30 NOTE — ED Provider Notes (Signed)
 [facility]    CSN: [id number] Arrival date & time: 11/30/23  1039      History   Chief Complaint Chief Complaint  Patient presents with   Blister    Entered by patient    HPI Roberto Hanson is a 58 y.o. male.   Patient presents with 1 week history of itchy rash to bilateral arms, chest, neck, back. Denies any changes in the environment that could have caused rash. Denies any known sick contacts. Denies fever. Has been using lotrimin cream with no improvement.      Past Medical History:  Diagnosis Date   Chronic back pain    Chronic neck pain    Eye disorder    OSA (obstructive sleep apnea)    cpap    Patient Active Problem List   Diagnosis Date Noted   Obstructive sleep apnea treated with continuous positive airway pressure (CPAP) 03/09/2021   Pruritus 11/25/2020   Controlled substance agreement signed 12/12/2019   Chronic cervical pain 04/03/2017   GAD (generalized anxiety disorder) 11/03/2014   Smoking addiction 11/03/2012   Low vision, both eyes 11/03/2012   Chronic lumbar pain 11/03/2012    Past Surgical History:  Procedure Laterality Date   COLONOSCOPY WITH PROPOFOL  N/A 05/22/2017   Procedure: COLONOSCOPY WITH PROPOFOL ;  Surgeon: Suzette Espy, MD;  [facility];  Service: Endoscopy;  Laterality: N/A;  12:00pm   COLONOSCOPY WITH PROPOFOL  N/A 07/05/2022   Procedure: COLONOSCOPY WITH PROPOFOL ;  Surgeon: Suzette Espy, MD;  [facility];  Service: Endoscopy;  Laterality: N/A;  11:00 AM   EYE SURGERY     POLYPECTOMY  05/22/2017   Procedure: POLYPECTOMY;  Surgeon: Suzette Espy, MD;  [facility];  Service: Endoscopy;;  colon   POLYPECTOMY  07/05/2022   Procedure: POLYPECTOMY INTESTINAL;  Surgeon: Suzette Espy, MD;  [facility];  Service: Endoscopy;;       Home Medications    Prior to Admission medications   Medication Sig Start Date End Date Taking? Authorizing Provider  naproxen   (NAPROSYN ) 500 MG tablet TAKE 1 TABLET (500 MG TOTAL) BY MOUTH 2 (TWO) TIMES DAILY WITH A MEAL. Patient taking differently: Take 500 mg by mouth 2 (two) times daily as needed for moderate pain (pain score 4-6). 05/22/21  Yes Joyce, Britney F, FNP  triamcinolone cream (KENALOG) 0.1 % Apply 1 Application topically 2 (two) times daily. 11/30/23  Yes Clarnce Homan, Fairy Homer E, FNP  Ascorbic Acid (VITAMIN C) 1000 MG tablet Take 1,000 mg by mouth every other day.    [provider]  clotrimazole-betamethasone (LOTRISONE) cream Apply 1 Application topically 2 (two) times daily as needed (rash).    [provider]  pseudoephedrine-guaifenesin (MUCINEX D) 60-600 MG 12 hr tablet Take 1 tablet by mouth 2 (two) times daily as needed for congestion. As needed    [provider]    Family History Family History  Problem Relation Age of Onset   Diabetes Mother    Diabetes Father    Diabetes Brother    Glaucoma Sister    Healthy Daughter    Glaucoma Brother    Diabetes Brother    Glaucoma Sister    Colon cancer Neg Hx     Social History Social History   Tobacco Use   Smoking status: Some Days    Current packs/day: 0.00    Average packs/day: 0.3 packs/day for 34.0 years (8.5 ttl pk-yrs)    Types: Cigarettes  Start date: 04/18/1981    Last attempt to quit: 04/19/2015    Years since quitting: 8.6   Smokeless tobacco: Never  Vaping Use   Vaping status: Never Used  Substance Use Topics   Alcohol use: No    Comment: rare   Drug use: No     Allergies   Patient has no known allergies.   Review of Systems Review of Systems Per HPI  Physical Exam Triage Vital Signs ED Triage Vitals  Encounter Vitals Group     BP 11/30/23 1053 129/81     Girls Systolic BP Percentile --      Girls Diastolic BP Percentile --      Boys Systolic BP Percentile --      Boys Diastolic BP Percentile --      Pulse Rate 11/30/23 1053 70     Resp 11/30/23 1053 20     Temp 11/30/23 1053 98.5 F  (36.9 C)     Temp Source 11/30/23 1053 Oral     SpO2 11/30/23 1053 96 %     Weight --      Height --      Head Circumference --      Peak Flow --      Pain Score 11/30/23 1050 0     Pain Loc --      Pain Education --      Exclude from Growth Chart --    No data found.  Updated Vital Signs BP 129/81 (BP Location: Right Arm)   Pulse 70   Temp 98.5 F (36.9 C) (Oral)   Resp 20   SpO2 96%   Visual Acuity Right Eye Distance:   Left Eye Distance:   Bilateral Distance:    Right Eye Near:   Left Eye Near:    Bilateral Near:     Physical Exam Constitutional:      General: He is not in acute distress.    Appearance: Normal appearance. He is not toxic-appearing or diaphoretic.  HENT:     Head: Normocephalic and atraumatic.   Eyes:     Extraocular Movements: Extraocular movements intact.     Conjunctiva/sclera: Conjunctivae normal.   Pulmonary:     Effort: Pulmonary effort is normal.   Skin:    Comments: Patient has scattered singular maculopapular lesions scattered throughout bilateral arms, chest, right side of neck, left side of back. Lesions to neck are in a cluster. No purulent drainage.    Neurological:     General: No focal deficit present.     Mental Status: He is alert and oriented to person, place, and time. Mental status is at baseline.   Psychiatric:        Mood and Affect: Mood normal.        Behavior: Behavior normal.        Thought Content: Thought content normal.        Judgment: Judgment normal.      UC Treatments / Results  Labs (all labs ordered are listed, but only abnormal results are displayed) Labs Reviewed - No data to display  EKG   Radiology No results found.  Procedures Procedures (including critical care time)  Medications Ordered in UC Medications  dexamethasone (DECADRON) injection 10 mg (10 mg Intramuscular Given 11/30/23 1113)    Initial Impression / Assessment and Plan / UC Course  I have reviewed the triage vital  signs and the nursing notes.  Pertinent labs & imaging results that were available during my  care of the patient were reviewed by me and considered in my medical decision making (see chart for details).     Rash appears to be consistent with insect bite. Most consistent with possible ant bites. There is no signs of bacterial, viral, fungal infection on exam. Patient was prescribed steroid cream and advised that antihistamines will be helpful. He states that he has zyrtec at home. Given how diffuse rash is and how significant it is around the neck, IM decadron was also administered. No obvious contraindications to steroid therapy noted in patient's history. Advised patient to wash all clothes and linen as well. No signs of anaphylaxis on exam. Advised strict follow up precautions. Patient verbalized understanding and was agreeable with plan.  Final Clinical Impressions(s) / UC Diagnoses   Final diagnoses:  Rash and nonspecific skin eruption     Discharge Instructions      I have prescribed a topical cream for your rash. You were also given a steroid shot today. Please follow up if symptoms persist or worsen. Please use the prescribed cream twice daily to affected areas.     ED Prescriptions     Medication Sig Dispense Auth. Provider   triamcinolone cream (KENALOG) 0.1 % Apply 1 Application topically 2 (two) times daily. 80 g [place], [place]      PDMP not reviewed this encounter.   Dodson [place], [place] 11/30/23 1126

## 2023-11-30 NOTE — Discharge Instructions (Addendum)
 I have prescribed a topical cream for your rash. You were also given a steroid shot today. Please follow up if symptoms persist or worsen. Please use the prescribed cream twice daily to affected areas.

## 2024-03-16 ENCOUNTER — Ambulatory Visit: Payer: Medicare (Managed Care)

## 2024-03-16 VITALS — BP 101/66 | HR 71 | Ht 68.0 in | Wt 228.0 lb

## 2024-03-16 DIAGNOSIS — Z0000 Encounter for general adult medical examination without abnormal findings: Secondary | ICD-10-CM

## 2024-03-16 NOTE — Progress Notes (Signed)
 Subjective:   Roberto Hanson is a 58 y.o. who presents for a Medicare Wellness preventive visit.  As a reminder, Annual Wellness Visits don't include a physical exam, and some assessments may be limited, especially if this visit is performed virtually. We may recommend an in-person follow-up visit with your provider if needed.  Visit Complete: Virtual I connected with  Roberto Hanson on 03/16/24 by a audio enabled telemedicine application and verified that I am speaking with the correct person using two identifiers.  Patient Location: Home  Provider Location: Home Office  I discussed the limitations of evaluation and management by telemedicine. The patient expressed understanding and agreed to proceed.  Vital Signs: Because this visit was a virtual/telehealth visit, some criteria may be missing or patient reported. Any vitals not documented were not able to be obtained and vitals that have been documented are patient reported.  VideoDeclined- This patient declined Librarian, academic. Therefore the visit was completed with audio only.  Persons Participating in Visit: Patient.  AWV Questionnaire: No: Patient Medicare AWV questionnaire was not completed prior to this visit.  Cardiac Risk Factors include: advanced age (>59men, >59 women);obesity (BMI >30kg/m2);smoking/ tobacco exposure;male gender     Objective:    Today's Vitals   03/16/24 0826  BP: 101/66  Pulse: 71  Weight: 228 lb (103.4 kg)  Height: 5' 8 (1.727 m)   Body mass index is 34.67 kg/m.     03/16/2024    8:20 AM 03/15/2023    8:04 AM 07/05/2022    9:34 AM 03/06/2022    9:18 AM 03/03/2021    9:14 AM 10/23/2019    2:12 PM 05/16/2017   10:16 AM  Advanced Directives  Does Patient Have a Medical Advance Directive? No No No No No No No   Would patient like information on creating a medical advance directive?  Yes (MAU/Ambulatory/Procedural Areas - Information given) Yes  (MAU/Ambulatory/Procedural Areas - Information given) No - Patient declined No - Patient declined No - Patient declined No - Patient declined      Data saved with a previous flowsheet row definition    Current Medications (verified) Outpatient Encounter Medications as of 03/16/2024  Medication Sig   Ascorbic Acid (VITAMIN C) 1000 MG tablet Take 1,000 mg by mouth every other day.   clotrimazole-betamethasone (LOTRISONE) cream Apply 1 Application topically 2 (two) times daily as needed (rash).   naproxen  (NAPROSYN ) 500 MG tablet TAKE 1 TABLET (500 MG TOTAL) BY MOUTH 2 (TWO) TIMES DAILY WITH A MEAL. (Patient taking differently: Take 500 mg by mouth 2 (two) times daily as needed for moderate pain (pain score 4-6).)   pseudoephedrine-guaifenesin (MUCINEX D) 60-600 MG 12 hr tablet Take 1 tablet by mouth 2 (two) times daily as needed for congestion. As needed   triamcinolone  cream (KENALOG ) 0.1 % Apply 1 Application topically 2 (two) times daily.   No facility-administered encounter medications on file as of 03/16/2024.    Allergies (verified) Patient has no known allergies.   History: Past Medical History:  Diagnosis Date   Chronic back pain    Chronic neck pain    Eye disorder    OSA (obstructive sleep apnea)    cpap   Past Surgical History:  Procedure Laterality Date   COLONOSCOPY WITH PROPOFOL  N/A 05/22/2017   Procedure: COLONOSCOPY WITH PROPOFOL ;  Surgeon: Shaaron Lamar HERO, MD;  [facility];  Service: Endoscopy;  Laterality: N/A;  12:00pm   COLONOSCOPY WITH PROPOFOL  N/A 07/05/2022  Procedure: COLONOSCOPY WITH PROPOFOL ;  Surgeon: Shaaron Lamar HERO, MD;  [facility];  Service: Endoscopy;  Laterality: N/A;  11:00 AM   EYE SURGERY     POLYPECTOMY  05/22/2017   Procedure: POLYPECTOMY;  Surgeon: Shaaron Lamar HERO, MD;  [facility];  Service: Endoscopy;;  colon   POLYPECTOMY  07/05/2022   Procedure: POLYPECTOMY INTESTINAL;  Surgeon: Shaaron Lamar HERO, MD;   [facility];  Service: Endoscopy;;   Family History  Problem Relation Age of Onset   Diabetes Mother    Diabetes Father    Diabetes Brother    Glaucoma Sister    Healthy Daughter    Glaucoma Brother    Diabetes Brother    Glaucoma Sister    Colon cancer Neg Hx    Social History   Socioeconomic History   Marital status: Single    Spouse name: Not on file   Number of children: 1   Years of education: Not on file   Highest education level: 11th grade  Occupational History   Occupation: employed  Tobacco Use   Smoking status: Some Days    Current packs/day: 0.00    Average packs/day: 0.3 packs/day for 34.0 years (8.5 ttl pk-yrs)    Types: Cigarettes    Start date: 04/18/1981    Last attempt to quit: 04/19/2015    Years since quitting: 8.9   Smokeless tobacco: Never  Vaping Use   Vaping status: Never Used  Substance and Sexual Activity   Alcohol use: No    Comment: rare   Drug use: No   Sexual activity: Not Currently  Other Topics Concern   Not on file  Social History Narrative   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   Girlfriend assists with transportation, cooking, etc   Social Drivers of Health   Financial Resource Strain: Low Risk  (03/16/2024)   Overall Financial Resource Strain (CARDIA)    Difficulty of Paying Living Expenses: Not hard at all  Food Insecurity: No Food Insecurity (03/16/2024)   Hunger Vital Sign    Worried About Running Out of Food in the Last Year: Never true    Ran Out of Food in the Last Year: Never true  Transportation Needs: No Transportation Needs (03/16/2024)   PRAPARE - Administrator, Civil Service (Medical): No    Lack of Transportation (Non-Medical): No  Physical Activity: Insufficiently Active (03/16/2024)   Exercise Vital Sign    Days of Exercise per Week: 7 days    Minutes of Exercise per Session: 10 min  Stress: No Stress Concern Present (03/16/2024)   [employer] of Occupational Health - Occupational Stress  Questionnaire    Feeling of Stress: Not at all  Social Connections: Moderately Isolated (03/16/2024)   Social Connection and Isolation Panel    Frequency of Communication with Friends and Family: More than three times a week    Frequency of Social Gatherings with Friends and Family: More than three times a week    Attends Religious Services: 1 to 4 times per year    Active Member of [employer] or Organizations: No    Attends Engineer, structural: Never    Marital Status: Divorced    Tobacco Counseling Ready to quit: No Counseling given: Yes    Clinical Intake:  Pre-visit preparation completed: Yes  Pain : No/denies pain     BMI - recorded: 34.67 Nutritional Status: BMI > 30  Obese Nutritional Risks: None Diabetes: No  Lab Results  Component Value Date   HGBA1C 5.4% 06/24/2015     How often do you need to have someone help you when you read instructions, pamphlets, or other written materials from your doctor or pharmacy?: 1 - Never (per pt have someone to help him w/reading)  Interpreter Needed?: No  Information entered by :: alia t/cma   Activities of Daily Living     03/16/2024    8:16 AM  In your present state of health, do you have any difficulty performing the following activities:  Hearing? 0  Vision? 1  Difficulty concentrating or making decisions? 0  Walking or climbing stairs? 0  Dressing or bathing? 0  Doing errands, shopping? 1  Comment pt goes by transportation/girlfriend  Quarry manager and eating ? N  Using the Toilet? N  In the past six months, have you accidently leaked urine? N  Do you have problems with loss of bowel control? N  Managing your Medications? N  Managing your Finances? N  Housekeeping or managing your Housekeeping? N    Patient Care Team: Roberto Annabella HERO, FNP as PCP - General (Family Medicine) Shaaron Lamar HERO, MD as Consulting Physician (Gastroenterology)  I have updated your Care Teams any recent Medical Services  you may have received from other providers in the past year.     Assessment:   This is a routine wellness examination for Roberto Hanson.  Hearing/Vision screen Hearing Screening - Comments:: Pt denies hearing dif Vision Screening - Comments:: Pt goes to [facility] in [place], [place]/last ov 01/2024/ pt is legally blind   Goals Addressed   None    Depression Screen     03/16/2024    8:21 AM 03/15/2023    8:03 AM 07/06/2022   10:50 AM 03/06/2022    9:23 AM 06/09/2021    3:36 PM 03/09/2021    2:31 PM 03/03/2021    9:36 AM  PHQ 2/9 Scores  PHQ - 2 Score 0 0 0 0 0 0 0  PHQ- 9 Score   0   0     Fall Risk     03/16/2024    8:13 AM 10/17/2023    3:06 PM 03/15/2023    8:02 AM 07/06/2022   10:50 AM 04/06/2022   12:12 PM  Fall Risk   Falls in the past year? 0 0 0 0 0  Number falls in past yr: 0  0    Injury with Fall? 0  0    Risk for fall due to : No Fall Risks  No Fall Risks    Follow up Falls evaluation completed  Falls prevention discussed      MEDICARE RISK AT HOME:  Medicare Risk at Home Any stairs in or around the home?: Yes If so, are there any without handrails?: Yes Home free of loose throw rugs in walkways, pet beds, electrical cords, etc?: Yes Adequate lighting in your home to reduce risk of falls?: Yes Life alert?: No Use of a cane, walker or w/c?: No Grab bars in the bathroom?: No Shower chair or bench in shower?: No Elevated toilet seat or a handicapped toilet?: No  TIMED UP AND GO:  Was the test performed?  no  Cognitive Function: 6CIT completed    11/16/2016    4:05 PM  MMSE - Mini Mental State Exam  Orientation to time 5   Orientation to Place 5   Registration 3   Attention/ Calculation 5   Recall 3  Language- name 2 objects 2   Language- repeat 1  Language- follow 3 step command 3   Language- read & follow direction 1   Write a sentence --   Write a sentence-comments unable to complete due to visual limitations   Copy design --   Copy  design-comments unable to complete due to visual limitations      Data saved with a previous flowsheet row definition        03/15/2023    8:05 AM 03/06/2022    9:20 AM 10/23/2019    2:14 PM 10/17/2018    2:39 PM  6CIT Screen  What Year? 0 points 0 points 0 points 0 points  What month? 0 points 0 points 0 points 0 points  What time? 0 points 0 points 0 points 0 points  Count back from 20 0 points 0 points 0 points 0 points  Months in reverse 0 points 0 points 0 points 0 points  Repeat phrase 0 points 0 points 0 points 0 points  Total Score 0 points 0 points 0 points 0 points    Immunizations Immunization History  Administered Date(s) Administered   Tdap 11/16/2016    Screening Tests Health Maintenance  Topic Date Due   COVID-19 Vaccine (1) Never done   HIV Screening  Never done   Hepatitis C Screening  Never done   Pneumococcal Vaccine: 50+ Years (1 of 2 - PCV) Never done   Hepatitis B Vaccines 19-59 Average Risk (1 of 3 - 19+ 3-dose series) Never done   Zoster Vaccines- Shingrix (1 of 2) Never done   Influenza Vaccine  Never done   Medicare Annual Wellness (AWV)  03/16/2025   DTaP/Tdap/Td (2 - Td or Tdap) 11/17/2026   Colonoscopy  07/05/2032   HPV VACCINES  Aged Out   Meningococcal B Vaccine  Aged Out    Health Maintenance Items Addressed: See Nurse Notes at the end of this note  Additional Screening:  Vision Screening: Recommended annual ophthalmology exams for early detection of glaucoma and other disorders of the eye. Is the patient up to date with their annual eye exam?  Yes  Who is the provider or what is the name of the office in which the patient attends annual eye exams? [facility]  Dental Screening: Recommended annual dental exams for proper oral hygiene  Community Resource Referral / Chronic Care Management: CRR required this visit?  No   CCM required this visit?  No   Plan:    I have personally reviewed and noted the following in the  patient's chart:   Medical and social history Use of alcohol, tobacco or illicit drugs  Current medications and supplements including opioid prescriptions. Patient is not currently taking opioid prescriptions. Functional ability and status Nutritional status Physical activity Advanced directives List of other physicians Hospitalizations, surgeries, and ER visits in previous 12 months Vitals Screenings to include cognitive, depression, and falls Referrals and appointments  In addition, I have reviewed and discussed with patient certain preventive protocols, quality metrics, and best practice recommendations. A written personalized care plan for preventive services as well as general preventive health recommendations were provided to patient.   Ozie Ned, CMA   03/16/2024   After Visit Summary: (MyChart) Due to this being a telephonic visit, the after visit summary with patients personalized plan was offered to patient via MyChart   Notes: PCP Follow Up Recommendations: Pt is aware and due the following: Hep B/C vaccines, flu-declined per pt,  pneumonia and shingles vaccine

## 2024-03-16 NOTE — Patient Instructions (Signed)
 [name],  Thank you for taking the time for your Medicare Wellness Visit. I appreciate your continued commitment to your health goals. Please review the care plan we discussed, and feel free to reach out if I can assist you further.  Medicare recommends these wellness visits once per year to help you and your care team stay ahead of potential health issues. These visits are designed to focus on prevention, allowing your provider to concentrate on managing your acute and chronic conditions during your regular appointments.  Please note that Annual Wellness Visits do not include a physical exam. Some assessments may be limited, especially if the visit was conducted virtually. If needed, we may recommend a separate in-person follow-up with your provider.  Ongoing Care Seeing your primary care provider every 3 to 6 months helps us  monitor your health and provide consistent, personalized care.   Referrals If a referral was made during today's visit and you haven't received any updates within two weeks, please contact the referred provider directly to check on the status.  Recommended Screenings:  Health Maintenance  Topic Date Due   COVID-19 Vaccine (1) Never done   HIV Screening  Never done   Hepatitis C Screening  Never done   Pneumococcal Vaccine for age over 65 (1 of 2 - PCV) Never done   Hepatitis B Vaccine (1 of 3 - 19+ 3-dose series) Never done   Zoster (Shingles) Vaccine (1 of 2) Never done   Flu Shot  Never done   Medicare Annual Wellness Visit  03/14/2024   DTaP/Tdap/Td vaccine (2 - Td or Tdap) 11/17/2026   Colon Cancer Screening  07/05/2032   HPV Vaccine  Aged Out   Meningitis B Vaccine  Aged Out       03/16/2024    8:20 AM  Advanced Directives  Does Patient Have a Medical Advance Directive? No   Advance Care Planning is important because it: Ensures you receive medical care that aligns with your values, goals, and preferences. Provides guidance to your family and loved  ones, reducing the emotional burden of decision-making during critical moments.  Vision: Annual vision screenings are recommended for early detection of glaucoma, cataracts, and diabetic retinopathy. These exams can also reveal signs of chronic conditions such as diabetes and high blood pressure.  Dental: Annual dental screenings help detect early signs of oral cancer, gum disease, and other conditions linked to overall health, including heart disease and diabetes.  Please see the attached documents for additional preventive care recommendations.

## 2024-06-08 ENCOUNTER — Encounter: Payer: Medicare (Managed Care) | Admitting: Family Medicine

## 2024-08-28 ENCOUNTER — Encounter: Payer: Medicare (Managed Care) | Admitting: Family Medicine

## 2025-03-17 ENCOUNTER — Ambulatory Visit: Payer: Medicare (Managed Care)

## 2025-03-19 ENCOUNTER — Ambulatory Visit: Payer: Medicare (Managed Care)
